# Patient Record
Sex: Male | Born: 1985 | Race: White | Hispanic: No | Marital: Married | Smoking: Former smoker
Health system: Southern US, Community
[De-identification: ages and names within clinical notes are randomized; demographics above are authoritative.]

## PROBLEM LIST (undated history)

## (undated) DIAGNOSIS — F32A Depression, unspecified: Secondary | ICD-10-CM

## (undated) DIAGNOSIS — Z8619 Personal history of other infectious and parasitic diseases: Secondary | ICD-10-CM

## (undated) DIAGNOSIS — F329 Major depressive disorder, single episode, unspecified: Secondary | ICD-10-CM

## (undated) HISTORY — DX: Depression, unspecified: F32.A

## (undated) HISTORY — DX: Personal history of other infectious and parasitic diseases: Z86.19

---
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
## 2006-03-12 HISTORY — PX: JOINT REPLACEMENT: SHX530

## 2016-01-18 ENCOUNTER — Ambulatory Visit (INDEPENDENT_AMBULATORY_CARE_PROVIDER_SITE_OTHER): Payer: Worker's Compensation | Admitting: Urgent Care

## 2016-01-18 VITALS — BP 118/82 | HR 104 | Temp 98.1°F | Resp 17 | Ht 76.5 in | Wt 269.0 lb

## 2016-01-18 DIAGNOSIS — S51812A Laceration without foreign body of left forearm, initial encounter: Secondary | ICD-10-CM

## 2016-01-18 DIAGNOSIS — Z026 Encounter for examination for insurance purposes: Secondary | ICD-10-CM | POA: Diagnosis not present

## 2016-01-18 NOTE — Progress Notes (Signed)
PROCEDURE NOTE: laceration repair Verbal consent obtained from patient.  Local anesthesia with 10cc Lidocaine 2% with epinephrine.  Wound explored for tendon, ligament damage. Wound scrubbed with soap and water and rinsed. Wound closed with #7 4-0 Prolene vertical mattress sutures.  Wound cleansed and dressed.  Benjiman CoreBrittany Wiseman, PA-C  [facility] 01/18/2016 6:09 PM

## 2016-01-18 NOTE — Progress Notes (Signed)
    MRN: [id number] DOB: 12/18/1985  Subjective:   Jeffrey Mooney is a 30 y.o. male presenting for worker's comp visit.   Reports suffering a laceration to his left forearm while at work today. Patient was using a razor blade, slipped and cut his forearm. He has applied pressure with a cloth to his laceration. Has not cleaned his wound. Denies fever, loss of ROM, weakness, numbness or tingling, swelling. He received his tetanus 2016 at his annual exam at a different practice.  Jeffrey Mooney's medications list, allergies, past medical history and past surgical history were reviewed and excluded from this note due to being a worker's comp case.   Objective:   Vitals: BP 118/82 (BP Location: Left Arm, Patient Position: Lying right side, Cuff Size: Small)   Pulse (!) 104   Temp 98.1 F (36.7 C) (Oral)   Resp 17   Ht 6' 4.5" (1.943 m)   Wt 269 lb (122 kg)   SpO2 98%   BMI 32.32 kg/m   Physical Exam  Constitutional: He is oriented to person, place, and time. He appears well-developed and well-nourished.  Pulmonary/Chest: Effort normal.  Musculoskeletal:       Left wrist: He exhibits normal range of motion, no tenderness, no bony tenderness, no swelling, no effusion, no crepitus and no deformity.       Left forearm: He exhibits tenderness (over laceration) and laceration (5.5cm). He exhibits no bony tenderness, no swelling, no edema and no deformity.  Neurological: He is alert and oriented to person, place, and time.   PROCEDURE NOTE: laceration repair Verbal consent obtained from patient.  Local anesthesia with 10cc Lidocaine 2% with epinephrine.  Wound explored for tendon, ligament damage. Wound scrubbed with soap and water and rinsed. Laceration repair performed by PA-Jeffrey Mooney, refer to note for detailed procedure.  Assessment and Plan :   1. Laceration of left forearm, initial encounter 2. Encounter related to worker's compensation claim - Wound care reviewed. Work restrictions  provided to patient. RTC for suture removal in 7-10 days, patient states that he may go to his PCP for this.  Jeffrey BambergMario Chenel Wernli, PA-C [facility] [phone] 01/18/2016 5:49 PM

## 2016-01-18 NOTE — Patient Instructions (Signed)

## 2016-01-20 ENCOUNTER — Telehealth: Payer: Self-pay

## 2016-01-20 NOTE — Telephone Encounter (Signed)
Patient's significant other called.  She states the patient needs paperwork stating that we stitched the cut on his arm and how big it was for the [employer]'s comp insurance.  She states that we need to fax the paperwork to [employer] if possible but she is also okay with picking it up in office.    Fax:[phone] phone: [phone]

## 2016-01-23 NOTE — Telephone Encounter (Signed)
Talked to pt to see if letter that Urban GibsonMani wrote at time of OV will work for [employer], or if something else is needed. He stated that he will ask her and if more is needed, he will have her call back with what further is needed.

## 2016-01-25 ENCOUNTER — Other Ambulatory Visit: Payer: Worker's Compensation

## 2016-01-26 ENCOUNTER — Ambulatory Visit (INDEPENDENT_AMBULATORY_CARE_PROVIDER_SITE_OTHER): Payer: Worker's Compensation | Admitting: Physician Assistant

## 2016-01-26 VITALS — BP 138/86 | HR 96 | Temp 98.3°F | Resp 18 | Ht 76.5 in | Wt 266.4 lb

## 2016-01-26 DIAGNOSIS — Z4802 Encounter for removal of sutures: Secondary | ICD-10-CM

## 2016-01-26 DIAGNOSIS — Z026 Encounter for examination for insurance purposes: Secondary | ICD-10-CM

## 2016-01-26 DIAGNOSIS — S51812D Laceration without foreign body of left forearm, subsequent encounter: Secondary | ICD-10-CM

## 2016-01-26 NOTE — Progress Notes (Signed)
   Patient: Jeffrey Mooney [id number]  Subjective: Jeffrey Mooney is returning for suture removal. Patient was initially seen 01/18/16 and had seven vertical mattress sutures placed. Denies fever, drainage of pus or blood, wound dehiscence, edema, pain.   Objective: Physical Exam  Constitutional: He is oriented to person, place, and time and well-developed, well-nourished, and in no distress.  HENT:  Head: Normocephalic and atraumatic.  Eyes: Conjunctivae are normal.  Neck: Normal range of motion.  Pulmonary/Chest: Effort normal.  Neurological: He is alert and oriented to person, place, and time. Gait normal.  Skin: Skin is warm and dry.     Psychiatric: Affect normal.  Vitals reviewed.   #7 sutures removed without incident.  Patient did get queasy and diaphoretic during suture removal. He was given a wet washcloth and a cup of water and felt much better after about 3 minutes. Steri strips applied to well healed laceration due to location and pt's job requiring constant use of his arms.   Assessment and Plan: 1. Encounter related to worker's compensation claim 2. Encounter for removal of sutures 3. Laceration of left forearm, subsequent encounter  Well-healed wound. Anticipatory guidance provided. Return to clinic as needed.  Benjiman CoreBrittany Emerly Prak, PA-C  [facility] 01/26/2016 12:53 PM

## 2016-01-26 NOTE — Patient Instructions (Addendum)
  Thank you for letting me participate in your health and well being.   Suture Removal, Care After Refer to this sheet in the next few weeks. These instructions provide you with information on caring for yourself after your procedure. Your health care provider may also give you more specific instructions. Your treatment has been planned according to current medical practices, but problems sometimes occur. Call your health care provider if you have any problems or questions after your procedure. What can I expect after the procedure? After your stitches (sutures) are removed, it is typical to have the following:  Some discomfort and swelling in the wound area.  Slight redness in the area. Follow these instructions at home:  If you have skin adhesive strips over the wound area, do not take the strips off. They will fall off on their own in a few days. If the strips remain in place after 14 days, you may remove them.  Change any bandages (dressings) at least once a day or as directed by your health care provider. If the bandage sticks, soak it off with warm, soapy water.  Apply cream or ointment only as directed by your health care provider. If using cream or ointment, wash the area with soap and water 2 times a day to remove all the cream or ointment. Rinse off the soap and pat the area dry with a clean towel.  Keep the wound area dry and clean. If the bandage becomes wet or dirty, or if it develops a bad smell, change it as soon as possible.  Continue to protect the wound from injury.  Use sunscreen when out in the sun. New scars become sunburned easily. Contact a health care provider if:  You have increasing redness, swelling, or pain in the wound.  You see pus coming from the wound.  You have a fever.  You notice a bad smell coming from the wound or dressing.  Your wound breaks open (edges not staying together). This information is not intended to replace advice given to you by  your health care provider. Make sure you discuss any questions you have with your health care provider. Document Released: 11/21/2000 Document Revised: 08/04/2015 Document Reviewed: 10/08/2012 Elsevier Interactive Patient Education  2017 [employer]     IF you received an x-ray today, you will receive an invoice from [facility]. Please contact [facility] at [phone] with questions or concerns regarding your invoice.   IF you received labwork today, you will receive an invoice from [facility]. Please contact [facility] at [phone] with questions or concerns regarding your invoice.   Our billing staff will not be able to assist you with questions regarding bills from these companies.  You will be contacted with the lab results as soon as they are available. The fastest way to get your results is to activate your My Chart account. Instructions are located on the last page of this paperwork. If you have not heard from us regarding the results in 2 weeks, please contact this office.

## 2016-12-12 ENCOUNTER — Ambulatory Visit
Admission: RE | Admit: 2016-12-12 | Discharge: 2016-12-12 | Disposition: A | Discharge status: Home / Self Care | Payer: BLUE CROSS/BLUE SHIELD | Source: Ambulatory Visit | Attending: Pain Medicine | Admitting: Pain Medicine

## 2016-12-12 ENCOUNTER — Other Ambulatory Visit: Payer: Self-pay | Admitting: Pain Medicine

## 2016-12-12 DIAGNOSIS — G8929 Other chronic pain: Secondary | ICD-10-CM

## 2016-12-12 DIAGNOSIS — M25562 Pain in left knee: Secondary | ICD-10-CM

## 2016-12-12 DIAGNOSIS — M25561 Pain in right knee: Secondary | ICD-10-CM

## 2016-12-12 DIAGNOSIS — M545 Low back pain: Principal | ICD-10-CM

## 2018-08-06 ENCOUNTER — Other Ambulatory Visit: Payer: Self-pay | Admitting: Chiropractic Medicine

## 2018-08-06 DIAGNOSIS — R52 Pain, unspecified: Secondary | ICD-10-CM

## 2018-08-06 DIAGNOSIS — R200 Anesthesia of skin: Secondary | ICD-10-CM

## 2018-08-06 DIAGNOSIS — R262 Difficulty in walking, not elsewhere classified: Secondary | ICD-10-CM

## 2018-10-17 ENCOUNTER — Emergency Department (HOSPITAL_COMMUNITY)
Admission: EM | Admit: 2018-10-17 | Discharge: 2018-10-17 | Disposition: A | Discharge status: Home / Self Care | Payer: BLUE CROSS/BLUE SHIELD | Attending: Emergency Medicine | Admitting: Emergency Medicine

## 2018-10-17 ENCOUNTER — Encounter (HOSPITAL_COMMUNITY): Payer: Self-pay

## 2018-10-17 ENCOUNTER — Other Ambulatory Visit: Payer: Self-pay

## 2018-10-17 DIAGNOSIS — M79652 Pain in left thigh: Secondary | ICD-10-CM | POA: Insufficient documentation

## 2018-10-17 DIAGNOSIS — M545 Low back pain: Secondary | ICD-10-CM | POA: Insufficient documentation

## 2018-10-17 DIAGNOSIS — M79605 Pain in left leg: Secondary | ICD-10-CM | POA: Insufficient documentation

## 2018-10-17 DIAGNOSIS — Z87891 Personal history of nicotine dependence: Secondary | ICD-10-CM | POA: Insufficient documentation

## 2018-10-17 DIAGNOSIS — Y9389 Activity, other specified: Secondary | ICD-10-CM | POA: Insufficient documentation

## 2018-10-17 DIAGNOSIS — Y92410 Unspecified street and highway as the place of occurrence of the external cause: Secondary | ICD-10-CM | POA: Insufficient documentation

## 2018-10-17 DIAGNOSIS — Y998 Other external cause status: Secondary | ICD-10-CM | POA: Insufficient documentation

## 2018-10-17 DIAGNOSIS — Z79899 Other long term (current) drug therapy: Secondary | ICD-10-CM | POA: Insufficient documentation

## 2018-10-17 DIAGNOSIS — F1210 Cannabis abuse, uncomplicated: Secondary | ICD-10-CM | POA: Insufficient documentation

## 2018-10-17 DIAGNOSIS — M7918 Myalgia, other site: Secondary | ICD-10-CM

## 2018-10-17 DIAGNOSIS — Z96651 Presence of right artificial knee joint: Secondary | ICD-10-CM | POA: Insufficient documentation
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
## 2018-10-17 NOTE — Discharge Instructions (Addendum)
I highly doubt you have a fracture. X-rays are essentially of no benefit in this scenario. I do not have a clear indication at this time for more advanced imaging such as CT or MRI. The therapy is anti-inflammatory medications and rest. I expect you to be sore for close to a week. I understand you are upset, but I do not expect this to give you long term impairment. If you develop new symptoms or are not happy with the progression of your symptoms then I want you re-checked by your PCP, sports medicine or orthopedics. Take 600 mg of ibuprofen every 6 hours as needed for pain.

## 2018-10-17 NOTE — ED Notes (Signed)
Discharge instructions reviewed with patient. Patient verbalizes understanding. VSS- patient reports he will follow up with PCP for hypertension. Patient very unhappy with his lack of xrays or medication today. Patient given number for patient experience.

## 2018-10-29 ENCOUNTER — Encounter (HOSPITAL_COMMUNITY): Payer: Self-pay

## 2018-10-29 ENCOUNTER — Emergency Department (HOSPITAL_COMMUNITY)
Admission: EM | Admit: 2018-10-29 | Discharge: 2018-10-29 | Disposition: A | Discharge status: Home / Self Care | Payer: No Typology Code available for payment source | Attending: Emergency Medicine | Admitting: Emergency Medicine

## 2018-10-29 ENCOUNTER — Other Ambulatory Visit: Payer: Self-pay

## 2018-10-29 ENCOUNTER — Emergency Department (HOSPITAL_COMMUNITY): Payer: No Typology Code available for payment source

## 2018-10-29 DIAGNOSIS — M545 Low back pain, unspecified: Secondary | ICD-10-CM

## 2018-10-29 DIAGNOSIS — R202 Paresthesia of skin: Secondary | ICD-10-CM | POA: Insufficient documentation

## 2018-10-29 DIAGNOSIS — Z79899 Other long term (current) drug therapy: Secondary | ICD-10-CM | POA: Diagnosis not present

## 2018-10-29 DIAGNOSIS — Z96651 Presence of right artificial knee joint: Secondary | ICD-10-CM | POA: Diagnosis not present

## 2018-10-29 DIAGNOSIS — F1210 Cannabis abuse, uncomplicated: Secondary | ICD-10-CM | POA: Diagnosis not present

## 2018-10-29 DIAGNOSIS — Z87891 Personal history of nicotine dependence: Secondary | ICD-10-CM | POA: Diagnosis not present

## 2018-10-29 DIAGNOSIS — R200 Anesthesia of skin: Secondary | ICD-10-CM | POA: Insufficient documentation

## 2018-10-29 DIAGNOSIS — M5416 Radiculopathy, lumbar region: Secondary | ICD-10-CM | POA: Diagnosis not present

## 2018-10-29 DIAGNOSIS — M25552 Pain in left hip: Secondary | ICD-10-CM | POA: Insufficient documentation

## 2018-10-29 MED ORDER — METHOCARBAMOL 750 MG PO TABS: 750.0000 mg | ORAL_TABLET | Freq: Two times a day (BID) | ORAL | 0 refills | Status: DC

## 2018-10-29 MED ORDER — MELOXICAM 7.5 MG PO TABS: 7.5000 mg | ORAL_TABLET | Freq: Every day | ORAL | 0 refills | Status: DC

## 2018-10-29 NOTE — Discharge Instructions (Addendum)
Stop taking Aleve or ibuprofen and begin taking Mobic as prescribed once daily (do not take more than this). This medication may take a couple days to notice a difference.  You can alternate with Tylenol.  Take Robaxin twice daily as needed for muscle pain or spasms.  Do not drive or operate machinery while taking this medication.  Attempt the exercises and stretches below daily as tolerated.  Use ice and heat alternating 20 minutes on, 20 minutes off.  Please follow-up with 1 of the spine specialist below for further evaluation and treatment of your ongoing back pain.  Please return immediately if you develop complete numbness of your groin or leg or loss of bowel or bladder control.
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
## 2019-05-26 ENCOUNTER — Encounter: Payer: Self-pay | Admitting: Physician Assistant

## 2019-05-26 ENCOUNTER — Ambulatory Visit (INDEPENDENT_AMBULATORY_CARE_PROVIDER_SITE_OTHER): Payer: 59 | Admitting: Physician Assistant

## 2019-05-26 ENCOUNTER — Other Ambulatory Visit: Payer: Self-pay

## 2019-05-26 DIAGNOSIS — F329 Major depressive disorder, single episode, unspecified: Secondary | ICD-10-CM

## 2019-05-26 DIAGNOSIS — F419 Anxiety disorder, unspecified: Secondary | ICD-10-CM | POA: Diagnosis not present

## 2019-05-26 DIAGNOSIS — M5126 Other intervertebral disc displacement, lumbar region: Secondary | ICD-10-CM | POA: Insufficient documentation

## 2019-05-26 DIAGNOSIS — F32A Depression, unspecified: Secondary | ICD-10-CM | POA: Insufficient documentation

## 2019-05-26 MED ORDER — BUPROPION HCL ER (XL) 150 MG PO TB24: 150.0000 mg | ORAL_TABLET | Freq: Every day | ORAL | 1 refills | Status: DC

## 2019-05-26 NOTE — Progress Notes (Signed)
   Virtual Visit via Video   I connected with patient on 05/26/19 at  1:00 PM EDT by a video enabled telemedicine application and verified that I am speaking with the correct person using two identifiers.  Location patient: Home Location provider: [facility], [facility] Persons participating in the virtual visit: Patient, Provider, CMA (Patina Moore)  I discussed the limitations of evaluation and management by telemedicine and the availability of in person appointments. The patient expressed understanding and agreed to proceed.  Subjective:   HPI:   Patient presents via [facility] today to establish care. Patient endorses longstanding history of depression, worsening over the past several months. Endorses associated stress with mind racing and some anhedonia. Denies SI/HI. Has been on medications in the past including Prozac and Hydroxyzine. Currently on Sertraline 50 mg which he notes helps slightly but causes significant sexual side effect. Would like to discuss other options. Is open to counseling.    ROS:   See pertinent positives and negatives per HPI.  There are no problems to display for this patient.   Social History   Tobacco Use  . Smoking status: Former Smoker    Types: Cigarettes    Quit date: 10/16/2012    Years since quitting: 6.6  . Smokeless tobacco: Never Used  Substance Use Topics  . Alcohol use: Yes    Alcohol/week: 7.0 standard drinks    Types: 7 Cans of beer per week    Current Outpatient Medications:  .  sertraline (ZOLOFT) 50 MG tablet, Take 50 mg by mouth daily., Disp: , Rfl:   Allergies  Allergen Reactions  . Morphine And Related Swelling    Objective:   There were no vitals taken for this visit.  Patient is well-developed, well-nourished in no acute distress.  Resting comfortably at home.  Head is normocephalic, atraumatic.  No labored breathing.  Speech is clear and coherent with logical content.  Patient is alert and oriented at  baseline.   Assessment and Plan:    1. Anxiety and depression Discussed options. Will wean down off of Sertraline taking 1/2 tablet daily for 1 week, then 1/2 tablet every other day for 1 week before stopping. Start Wellbutrin XL at 150 mg once daily. Follow-up 3-4 weeks. Number for [facility] given to patient to schedule a therapy appointment.  - buPROPion (WELLBUTRIN XL) 150 MG 24 hr tablet; Take 1 tablet (150 mg total) by mouth daily.  Dispense: 30 tablet; Refill: 1   Piedad Climes, [place] 05/26/2019

## 2019-05-26 NOTE — Patient Instructions (Signed)
Instructions sent to MyChart.  Please decrease the Sertraline to 1/2 tablet daily for 1 week. Then 1/2 tablet daily every other day for 1 week before completely stopping.  Start the Wellbutrin once daily each morning.   Call our counseling services at [phone] to schedule an appointment.  We will follow-up in 3-4 weeks for reassessment. Sooner if needed. We can do your physical in office at the same time if you prefer.   It was very nice meeting you today. Welcome to [facility]!

## 2019-05-26 NOTE — Progress Notes (Signed)
I have discussed the procedure for the virtual visit with the patient who has given consent to proceed with assessment and treatment.   Jeffrey Mooney, CMA     

## 2019-06-16 ENCOUNTER — Telehealth: Payer: Self-pay | Admitting: Emergency Medicine

## 2019-06-16 ENCOUNTER — Telehealth: Payer: Self-pay | Admitting: Physician Assistant

## 2019-06-16 DIAGNOSIS — F419 Anxiety disorder, unspecified: Secondary | ICD-10-CM

## 2019-06-16 DIAGNOSIS — F329 Major depressive disorder, single episode, unspecified: Secondary | ICD-10-CM

## 2019-06-16 DIAGNOSIS — F32A Depression, unspecified: Secondary | ICD-10-CM

## 2019-06-16 MED ORDER — ESCITALOPRAM OXALATE 10 MG PO TABS: 10.0000 mg | ORAL_TABLET | Freq: Every day | ORAL | 1 refills | Status: DC

## 2019-06-16 MED ORDER — VORTIOXETINE HBR 20 MG PO TABS: 10.0000 mg | ORAL_TABLET | Freq: Every day | ORAL | 1 refills | Status: DC

## 2019-06-16 NOTE — Telephone Encounter (Signed)
Advised patient of PCP changing Wellbutrin to Trintellix. Patient is agreeable with switching medication. He was taking Prozac first but didn't feel strong enough, then Sertraline caused sexual side effects and Wellbutrin is causing anger and irritable symptoms. Rx for Trintellix 10 mg [facility]. Advised to follow up in 2 weeks.

## 2019-06-16 NOTE — Telephone Encounter (Signed)
Patient called in asking if Selena Batten could prescribe him something other than the buPROPion (WELLBUTRIN XL) 150 MG 24 hr tablet states it is having an opposite effect on him.

## 2019-06-16 NOTE — Telephone Encounter (Signed)
Called patient but unable to leave a message on VM due to VM not set up.

## 2019-06-16 NOTE — Telephone Encounter (Signed)
Patient states when he started taking the Wellbutrin. He has become very irritable, very angry. Please advise

## 2019-06-16 NOTE — Telephone Encounter (Signed)
PA denied dor Trintellix thru patient insurance Since he has failed Prozac the other medications to fail are Cymbalta, Effexor, Pristiq, Fetxima and Viibryd Please advise

## 2019-06-16 NOTE — Addendum Note (Signed)
Addended by: Con Memos on: 06/16/2019 03:25 PM   Modules accepted: Orders

## 2019-06-16 NOTE — Telephone Encounter (Signed)
Would actually recommend consider trial of Lexapro first (verify that he has not taken in the past) as statistically has lower side effect profile than SNRI. If he has taken before we will go with Effexor XR 37.5 mg once daily. If he has not taken, ok to send in Lexapro 10 mg once daily. Quant 30 with 1 refill. Follow-up in in a few weeks.

## 2019-06-16 NOTE — Telephone Encounter (Signed)
Spoke with patient and he denies ever taking Lexapro. Rx for Lexparo 10 mg QD sent to the pharmacy.

## 2019-06-16 NOTE — Telephone Encounter (Signed)
I know he has had significant issue with sexual side effects on sertraline which is why we switched to Wellbutrin.  Has also been on Prozac previously.  Would recommend we consider trying Trintellix as this has a very low risk of any sexual side effects.  Suspect insurance will cover this since he has tried and failed sertraline, fluoxetine and Wellbutrin.  Okay to send in Rx for 10 mg Trintellix, quantity 30 with 1 refill.  He will go ahead and take this instead of his Wellbutrin.  If there is any issue getting from the pharmacy, please let us know and we will select an alternative agent.  Would have him follow-up with me in 2 weeks after medication change.

## 2019-06-16 NOTE — Telephone Encounter (Signed)
Prior authorization initiated thru Cover My Meds portal for patient medication Trintellix 20 mg  Key: [facility] [employer]  PA Case ID: [id number]  Rx #: [id number]

## 2019-07-09 ENCOUNTER — Other Ambulatory Visit: Payer: Self-pay | Admitting: Physician Assistant

## 2019-07-09 DIAGNOSIS — F419 Anxiety disorder, unspecified: Secondary | ICD-10-CM

## 2019-07-09 DIAGNOSIS — F329 Major depressive disorder, single episode, unspecified: Secondary | ICD-10-CM

## 2019-07-28 ENCOUNTER — Other Ambulatory Visit: Payer: Self-pay | Admitting: Physician Assistant

## 2019-07-28 DIAGNOSIS — F32A Depression, unspecified: Secondary | ICD-10-CM

## 2019-08-09 ENCOUNTER — Other Ambulatory Visit: Payer: Self-pay | Admitting: Physician Assistant

## 2019-08-09 DIAGNOSIS — F32A Depression, unspecified: Secondary | ICD-10-CM

## 2019-08-11 ENCOUNTER — Encounter: Payer: Self-pay | Admitting: Emergency Medicine

## 2019-08-11 NOTE — Telephone Encounter (Signed)
My chart message sent to patient of how he is tolerating medication.

## 2019-09-04 ENCOUNTER — Other Ambulatory Visit: Payer: Self-pay | Admitting: Physician Assistant

## 2019-09-04 DIAGNOSIS — F32A Depression, unspecified: Secondary | ICD-10-CM

## 2019-09-24 ENCOUNTER — Other Ambulatory Visit: Payer: Self-pay

## 2019-09-24 ENCOUNTER — Encounter: Payer: Self-pay | Admitting: Physician Assistant

## 2019-09-24 ENCOUNTER — Telehealth (INDEPENDENT_AMBULATORY_CARE_PROVIDER_SITE_OTHER): Payer: 59 | Admitting: Physician Assistant

## 2019-09-24 DIAGNOSIS — F419 Anxiety disorder, unspecified: Secondary | ICD-10-CM

## 2019-09-24 DIAGNOSIS — F329 Major depressive disorder, single episode, unspecified: Secondary | ICD-10-CM

## 2019-09-24 MED ORDER — ESCITALOPRAM OXALATE 20 MG PO TABS: 20.0000 mg | ORAL_TABLET | Freq: Every day | ORAL | 3 refills | Status: DC

## 2019-09-24 NOTE — Progress Notes (Signed)
   Virtual Visit via Video   I connected with patient on 09/24/19 at  9:00 AM EDT by a video enabled telemedicine application and verified that I am speaking with the correct person using two identifiers.  Location patient: Home Location provider: [facility], Office Persons participating in the virtual visit: Patient, Provider, CMA (Patina Moore)  I discussed the limitations of evaluation and management by telemedicine and the availability of in person appointments. The patient expressed understanding and agreed to proceed.  Subjective:   HPI:   Patient presents via [facility] today for follow-up regarding anxiety and mood. Patient is currently on a regimen of Lexapro 10 mg daily. Endorses taking medication as directed and tolerating well overall. Notes initially doing very well with current dose, with improved mood and resolution of anhedonia. Over the past few weeks has noted that he is feeling more down. Has noted sleeping more and feeling very disinterested in things that he typically enjoys. Denies SI/HI. Denies any new or worsening stressors. Denies recent change at work or at home.   ROS:   See pertinent positives and negatives per HPI.  Patient Active Problem List   Diagnosis Date Noted  . Lumbar herniated disc 05/26/2019  . Anxiety and depression 05/26/2019    Social History   Tobacco Use  . Smoking status: Former Smoker    Types: Cigarettes    Quit date: 10/16/2012    Years since quitting: 6.9  . Smokeless tobacco: Never Used  Substance Use Topics  . Alcohol use: Yes    Alcohol/week: 12.0 standard drinks    Types: 12 Cans of beer per week    Current Outpatient Medications:  .  escitalopram (LEXAPRO) 10 MG tablet, Take 1 tablet (10 mg total) by mouth daily. Please call the office to schedule a follow up appointment for refills, Disp: 30 tablet, Rfl: 1  Allergies  Allergen Reactions  . Morphine And Related Swelling    Objective:   There were no vitals  taken for this visit.  Patient is well-developed, well-nourished in no acute distress.  Resting comfortably at home.  Head is normocephalic, atraumatic.  No labored breathing.  Speech is clear and coherent with logical content.  Patient is alert and oriented at baseline.   Assessment and Plan:   1. Anxiety and depression Deteriorated. No alarm signs or symptoms. Will increase Lexapro to 20 mg daily. Follow-up 4-6 weeks via video visit. Sooner if needed.     Piedad Climes, PA-C 09/24/2019

## 2019-09-24 NOTE — Progress Notes (Signed)
I have discussed the procedure for the virtual visit with the patient who has given consent to proceed with assessment and treatment.   Jeffrey Mooney S Tamara Kenyon, CMA     

## 2019-10-20 ENCOUNTER — Encounter: Payer: Self-pay | Admitting: Emergency Medicine

## 2019-10-20 ENCOUNTER — Other Ambulatory Visit: Payer: Self-pay | Admitting: Physician Assistant

## 2019-10-20 DIAGNOSIS — F419 Anxiety disorder, unspecified: Secondary | ICD-10-CM

## 2019-10-21 ENCOUNTER — Other Ambulatory Visit: Payer: Self-pay | Admitting: Physician Assistant

## 2019-10-21 DIAGNOSIS — F419 Anxiety disorder, unspecified: Secondary | ICD-10-CM

## 2019-11-19 ENCOUNTER — Encounter: Payer: Self-pay | Admitting: Physician Assistant

## 2019-11-19 ENCOUNTER — Telehealth (INDEPENDENT_AMBULATORY_CARE_PROVIDER_SITE_OTHER): Payer: 59 | Admitting: Physician Assistant

## 2019-11-19 ENCOUNTER — Other Ambulatory Visit: Payer: Self-pay

## 2019-11-19 DIAGNOSIS — F3162 Bipolar disorder, current episode mixed, moderate: Secondary | ICD-10-CM | POA: Diagnosis not present

## 2019-11-19 MED ORDER — LAMOTRIGINE 25 MG PO TABS: ORAL_TABLET | ORAL | 1 refills | Status: DC

## 2019-11-19 NOTE — Progress Notes (Signed)
I have discussed the procedure for the virtual visit with the patient who has given consent to proceed with assessment and treatment.   Gerhart Ruggieri S Cindee Mclester, CMA     

## 2019-11-19 NOTE — Progress Notes (Signed)
   Virtual Visit via Video   I connected with patient on 11/19/19 at  9:00 AM EDT by a video enabled telemedicine application and verified that I am speaking with the correct person using two identifiers.  Location patient: Home Location provider: [place] [facility], Office Persons participating in the virtual visit: Patient, Provider, CMA (Patina Moore)  I discussed the limitations of evaluation and management by telemedicine and the availability of in person appointments. The patient expressed understanding and agreed to proceed.  Subjective:   HPI:   Patient presents via Careg[facility] today to follow-up regarding depression. At last visit patient was noting some improved symptoms on Lexapro so the dose was increased to 20 mg daily. Notes with increased dose having significant irritability and almost explosive behavior. States he had almost a feeling of invincibility, making very rash decisions. Notes he did not feel himself. States he spoke to his previous behavioral health provider in [place] who had concerns of mania and potential underlying diagnosis of BPD. Notes only prior diagnosis from this provider was GAD. He weaned himself off of the Lexapro over the past few weeks. Denies SI/HI.    ROS:   See pertinent positives and negatives per HPI.  Patient Active Problem List   Diagnosis Date Noted  . Lumbar herniated disc 05/26/2019  . Anxiety and depression 05/26/2019    Social History   Tobacco Use  . Smoking status: Former Smoker    Types: Cigarettes    Quit date: 10/16/2012    Years since quitting: 7.0  . Smokeless tobacco: Never Used  Substance Use Topics  . Alcohol use: Yes    Alcohol/week: 12.0 standard drinks    Types: 12 Cans of beer per week    Current Outpatient Medications:  .  escitalopram (LEXAPRO) 20 MG tablet, TAKE 1 TABLET BY MOUTH EVERY DAY (Patient not taking: Reported on 11/19/2019), Disp: 90 tablet, Rfl: 0  Allergies  Allergen Reactions  . Morphine  And Related Swelling    Objective:   There were no vitals taken for this visit.  Patient is well-developed, well-nourished in no acute distress.  Resting comfortably at home.  Head is normocephalic, atraumatic.  No labored breathing.  Speech is clear and coherent with logical content.  Patient is alert and oriented at baseline.   Assessment and Plan:   1. Bipolar disorder, current episode mixed, moderate (HCC) SSRI resulted in manic behavior, concerning for BPD. Patient has reached out to prior [facility] provider in [place] who also agrees with diagnosis of BPD. Will start Lamictal at 25 mg x 2 weeks, increasing to 50 mg. Will likely need further titration within the next 3-4 weeks. Referral to psych placed for further management.  Will plan on office follow-up in 1 month if he has not already seen Psychiatry so we can recheck lamictal level and CBC.  - Ambulatory referral to Psychiatry - lamoTRIgine (LAMICTAL) 25 MG tablet; Take 1 tablet daily x 2 weeks. Then increase to 2 tablets (50 mg) daily.  Dispense: 60 tablet; Refill: 1    Piedad Climes, [place] 11/19/2019

## 2019-12-11 ENCOUNTER — Telehealth: Payer: Self-pay | Admitting: Physician Assistant

## 2019-12-11 NOTE — Telephone Encounter (Signed)
Pt called in stating that he is currently taking Lamictal he wanted to see about adding Trintellix as well. Pt can be reached at the home # and [facility] [address]

## 2019-12-11 NOTE — Telephone Encounter (Signed)
Please advise 

## 2019-12-11 NOTE — Telephone Encounter (Signed)
Would not want to add that on at the moment.  Would work on continue titrating Lamictal to get mood stable before we consider adding back on an SSRI.  So also will be something to discuss with psychiatry --referral placed at last visit

## 2019-12-12 ENCOUNTER — Other Ambulatory Visit: Payer: Self-pay | Admitting: Physician Assistant

## 2019-12-12 DIAGNOSIS — F3162 Bipolar disorder, current episode mixed, moderate: Secondary | ICD-10-CM

## 2019-12-14 ENCOUNTER — Other Ambulatory Visit: Payer: Self-pay | Admitting: Physician Assistant

## 2019-12-14 NOTE — Telephone Encounter (Signed)
Called patient in reference to PCP recommendations. Patient voiced understanding. States his previous doctor put him on rexulti. Asked about dosing and supply quantity. Patient did not go into details.

## 2019-12-14 NOTE — Telephone Encounter (Signed)
FYI, called patient with PCP recommendations. He states he did not mean to step on any toes. He states he speaks with his previous doctor from time to time because he was seen there for so long. He states they are up in [place]. He states he just gets advice from the previous doctor to go over with PCP. He states he is not taking Rexulti nor has he been on it before. He states his old doctor told him he should ask your advice/opinion on the medication. Patient states seeing his previous doctor is not an option.

## 2019-12-14 NOTE — Telephone Encounter (Signed)
If he is speaking with prior doctor about symptoms as well then he can continue care with them or will need to let us prescribe appropriate medications while awaiting psychiatric referral. Should not be multiple providers prescribing medications for the same issue. I will defer further refills of Lamictal to this prior psychiatric provider.

## 2019-12-19 ENCOUNTER — Other Ambulatory Visit: Payer: Self-pay | Admitting: Physician Assistant

## 2019-12-19 DIAGNOSIS — F419 Anxiety disorder, unspecified: Secondary | ICD-10-CM

## 2020-01-11 ENCOUNTER — Telehealth (HOSPITAL_COMMUNITY): Payer: 59 | Admitting: Behavioral Health

## 2020-01-11 ENCOUNTER — Other Ambulatory Visit: Payer: Self-pay

## 2020-01-12 ENCOUNTER — Telehealth (INDEPENDENT_AMBULATORY_CARE_PROVIDER_SITE_OTHER): Payer: 59 | Admitting: Physician Assistant

## 2020-01-12 ENCOUNTER — Other Ambulatory Visit: Payer: Self-pay

## 2020-01-12 ENCOUNTER — Encounter (HOSPITAL_COMMUNITY): Payer: Self-pay | Admitting: Physician Assistant

## 2020-01-12 DIAGNOSIS — F3160 Bipolar disorder, current episode mixed, unspecified: Secondary | ICD-10-CM | POA: Diagnosis not present

## 2020-01-12 DIAGNOSIS — F32A Depression, unspecified: Secondary | ICD-10-CM

## 2020-01-12 DIAGNOSIS — F319 Bipolar disorder, unspecified: Secondary | ICD-10-CM | POA: Insufficient documentation

## 2020-01-12 MED ORDER — LAMOTRIGINE 25 MG PO TABS: 75.0000 mg | ORAL_TABLET | Freq: Every day | ORAL | 2 refills | Status: DC

## 2020-01-12 MED ORDER — BUPROPION HCL ER (XL) 150 MG PO TB24: 150.0000 mg | ORAL_TABLET | ORAL | 2 refills | Status: DC
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
## 2020-01-16 ENCOUNTER — Other Ambulatory Visit: Payer: Self-pay | Admitting: Physician Assistant

## 2020-01-16 DIAGNOSIS — F3160 Bipolar disorder, current episode mixed, unspecified: Secondary | ICD-10-CM

## 2020-02-17 ENCOUNTER — Other Ambulatory Visit: Payer: Self-pay

## 2020-02-17 ENCOUNTER — Telehealth (INDEPENDENT_AMBULATORY_CARE_PROVIDER_SITE_OTHER): Payer: 59 | Admitting: Physician Assistant

## 2020-02-17 DIAGNOSIS — F32A Depression, unspecified: Secondary | ICD-10-CM

## 2020-02-17 DIAGNOSIS — F3160 Bipolar disorder, current episode mixed, unspecified: Secondary | ICD-10-CM | POA: Diagnosis not present

## 2020-02-17 NOTE — Progress Notes (Signed)
BH MD/PA/NP OP Progress Note  Virtual Visit via Telephone Note  I connected with Jeffrey Mooney on 02/17/2020 at  1:00 PM EST by telephone and verified that I am speaking with the correct person using two identifiers.  Location: Patient: Work Provider: Clinic   I discussed the limitations, risks, security and privacy concerns of performing an evaluation and management service by telephone and the availability of in person appointments. I also discussed with the patient that there may be a patient responsible charge related to this service. The patient expressed understanding and agreed to proceed.  Follow Up Instructions:   I discussed the assessment and treatment plan with the patient. The patient was provided an opportunity to ask questions and all were answered. The patient agreed with the plan and demonstrated an understanding of the instructions.   The patient was advised to call back or seek an in-person evaluation if the symptoms worsen or if the condition fails to improve as anticipated.  I provided 29 minutes of non-face-to-face time during this encounter.  Jeffrey Hatchet, PA   02/17/2020 1:20 PM Jeffrey Mooney  MRN:  [id number]  Chief Complaint: Follow-up and medication management  HPI:   Jeffrey Mooney is a 34 year old male with a past psychiatric history significant for bipolar disorder who presents to [facility] via virtual telephone visit for follow-up and medication management.  Patient is currently being managed on the following medications:  Bupropion 150 mg 24-hour tablet daily Lamictal 25 mg (3 tablets 75 mg total)  Patient reports that his medications are still coming along.  Patient reports that he went through a tough transition when dealing with his medications.  He states that he has experienced a lot of emotional bullshit in the form of mood swings.  He describes feeling overly emotional with and frequent mood swings  where 1 minute he will be happy, and the next minute, crying.  Patient also reports that he has been experiencing sexual side effects that manifest as not wanting to have sex. Patient expresses that he feels "more okay" than "not okay" and reports that he does feel like the medications are doing their job. He also reports that his coworkers have noticed a difference in his mood.    Writer inquired to patient on whether or not he wanted to adjust his medication dosages at this time.  He states that he would rather give the medication some time to work and ride out any of the side effects that he is feeling.  Patient denies the need for refills at this time.  Patient states that his mood has been more better than not.  He denies suicidal or homicidal ideations, however, he states that he hates people in general but does not wish to hurt anyone.  He further denies auditory or visual hallucinations.  Patient endorses fair sleep and appetite.  He endorses alcohol use and states that he consumes way too much.  He denies tobacco use and illicit drug use.  Patient reports no stressors at this time.  Visit Diagnosis:    ICD-10-CM   1. Bipolar affective disorder, current episode mixed, current episode severity unspecified (HCC)  F31.60   2. Depression, unspecified depression type  F32.A     Past Psychiatric History: Bipolar affective disorder, current episode mixed  Past Medical History:  Past Medical History:  Diagnosis Date  . Depression   . History of chickenpox     Past Surgical History:  Procedure Laterality Date  .  JOINT REPLACEMENT Right 2008   Right knee - torn meniscus    Family Psychiatric History: No family history of psychiatric illness  Family History:  Family History  Problem Relation Age of Onset  . Healthy Mother   . Healthy Father   . Healthy Sister   . Healthy Sister   . Lung cancer Maternal Grandmother     Social History:  Social History   Socioeconomic History  .  Marital status: Married    Spouse name: Not on file  . Number of children: Not on file  . Years of education: Not on file  . Highest education level: Not on file  Occupational History  . Not on file  Tobacco Use  . Smoking status: Former Smoker    Types: Cigarettes    Quit date: 10/16/2012    Years since quitting: 7.3  . Smokeless tobacco: Never Used  Vaping Use  . Vaping Use: Former  . Substances: THC, CBD, Mixture of cannabinoids  Substance and Sexual Activity  . Alcohol use: Yes    Alcohol/week: 12.0 standard drinks    Types: 12 Cans of beer per week  . Drug use: Not Currently    Types: Hydrocodone    Comment: not prescribed- previously  . Sexual activity: Not Currently  Other Topics Concern  . Not on file  Social History Narrative  . Not on file   Social Determinants of Health   Financial Resource Strain:   . Difficulty of Paying Living Expenses: Not on file  Food Insecurity:   . Worried About Programme researcher, broadcasting/film/video in the Last Year: Not on file  . Ran Out of Food in the Last Year: Not on file  Transportation Needs:   . Lack of Transportation (Medical): Not on file  . Lack of Transportation (Non-Medical): Not on file  Physical Activity:   . Days of Exercise per Week: Not on file  . Minutes of Exercise per Session: Not on file  Stress:   . Feeling of Stress : Not on file  Social Connections:   . Frequency of Communication with Friends and Family: Not on file  . Frequency of Social Gatherings with Friends and Family: Not on file  . Attends Religious Services: Not on file  . Active Member of Clubs or Organizations: Not on file  . Attends Banker Meetings: Not on file  . Marital Status: Not on file    Allergies:  Allergies  Allergen Reactions  . Morphine And Related Swelling    Metabolic Disorder Labs: No results found for: HGBA1C, MPG No results found for: PROLACTIN No results found for: CHOL, TRIG, HDL, CHOLHDL, VLDL, LDLCALC No results found  for: TSH  Therapeutic Level Labs: No results found for: LITHIUM No results found for: VALPROATE No components found for:  CBMZ  Current Medications: Current Outpatient Medications  Medication Sig Dispense Refill  . buPROPion (WELLBUTRIN XL) 150 MG 24 hr tablet Take 1 tablet (150 mg total) by mouth every morning. 30 tablet 2  . lamoTRIgine (LAMICTAL) 25 MG tablet Take 3 tablets (75 mg total) by mouth daily. 90 tablet 2   No current facility-administered medications for this visit.     Musculoskeletal: Strength & Muscle Tone: Unable to assess due to telemedicine visit Gait & Station: Unable to assess due to telemedicine visit Patient leans: Unable to assess due to telemedicine visit  Psychiatric Specialty Exam: Review of Systems  Psychiatric/Behavioral: Positive for agitation. Negative for dysphoric mood (Patient reports there instances  where he may experience mood swings), hallucinations, self-injury, sleep disturbance and suicidal ideas. The patient is not hyperactive.     There were no vitals taken for this visit.There is no height or weight on file to calculate BMI.  General Appearance: Unable to assess due to telemedicine visit  Eye Contact:  Unable to assess due to telemedicine visit  Speech:  Clear and Coherent and Normal Rate  Volume:  Normal  Mood:  Euthymic and Irritable  Affect:  Appropriate  Thought Process:  Coherent and Descriptions of Associations: Intact  Orientation:  Full (Time, Place, and Person)  Thought Content: WDL and Logical   Suicidal Thoughts:  No  Homicidal Thoughts:  Yes.  without intent/plan  Memory:  Immediate;   Good Recent;   Good Remote;   Good  Judgement:  Good  Insight:  Fair  Psychomotor Activity:  Normal  Concentration:  Concentration: Good and Attention Span: Good  Recall:  Good  Fund of Knowledge: Good  Language: Good  Akathisia:  NA  Handed:  Right  AIMS (if indicated): not done  Assets:  Communication Skills Desire for  Improvement Financial Resources/Insurance Housing Social Support Vocational/Educational  ADL's:  Intact  Cognition: WNL  Sleep:  Fair   Screenings: PHQ2-9     Video Visit from 09/24/2019 in [facility] Visit from 05/26/2019 in [facility] Visit from 01/26/2016 in Primary Care at [facility] Total Score 4 2 0  PHQ-9 Total Score 7 6 --       Assessment and Plan:   Jeffrey Mooney is a 34 year old male with a past psychiatric history significant for bipolar disorder who presents to [facility] via virtual telephone visit for follow-up and medication management.  Patient is currently being managed on the following medications:  Bupropion 150 mg 24-hour tablet daily Lamictal 25 mg (3 tablets 75 mg total)  Patient reports that he has been experiencing the following side effects from his medications: feeling overly emotional, infrequent mood swings, and sexual dysfunction that manifest as lack of interest in sexual intercourse.  Patient denies the need for dosage adjustments at this time and states that he will ride out the side effects of his medications.  Patient does report that he feels that his mood is improving and that on most days he is in a good mood more so than a bad mood.  Patient does not need any refills at this time.  1. Bipolar affective disorder, current episode mixed, current episode severity unspecified (HCC) Patient to continue taking lamotrigine 75 mg daily (3 tablets, 25 mg) Patient to continue taking Wellbutrin 150 mg 24-hour tablet daily  Patient to follow-up in 6 weeks   Jeffrey Hatchet, PA 02/17/2020, 1:20 PM

## 2020-03-30 ENCOUNTER — Encounter (HOSPITAL_COMMUNITY): Payer: Self-pay | Admitting: Physician Assistant

## 2020-03-30 ENCOUNTER — Telehealth (INDEPENDENT_AMBULATORY_CARE_PROVIDER_SITE_OTHER): Payer: 59 | Admitting: Physician Assistant

## 2020-03-30 ENCOUNTER — Other Ambulatory Visit: Payer: Self-pay

## 2020-03-30 DIAGNOSIS — F3160 Bipolar disorder, current episode mixed, unspecified: Secondary | ICD-10-CM

## 2020-03-30 DIAGNOSIS — F32A Depression, unspecified: Secondary | ICD-10-CM

## 2020-03-30 NOTE — Progress Notes (Signed)
BH MD/PA/NP OP Progress Note  Virtual Visit via Telephone Note  I connected with Jeffrey Mooney on 03/30/20 at 11:00 AM EST by telephone and verified that I am speaking with the correct person using two identifiers.  Location: Patient: Home Provider: Clinic   I discussed the limitations, risks, security and privacy concerns of performing an evaluation and management service by telephone and the availability of in person appointments. I also discussed with the patient that there may be a patient responsible charge related to this service. The patient expressed understanding and agreed to proceed.  Follow Up Instructions:  I discussed the assessment and treatment plan with the patient. The patient was provided an opportunity to ask questions and all were answered. The patient agreed with the plan and demonstrated an understanding of the instructions.   The patient was advised to call back or seek an in-person evaluation if the symptoms worsen or if the condition fails to improve as anticipated.  I provided 21 minutes of non-face-to-face time during this encounter.   Jeffrey Hatchet, PA   03/30/2020 9:30 PM Jeffrey Mooney  MRN:  [id number]  Chief Complaint:  Follow-up and medication management   HPI:  Jeffrey Mooney is a 35 year old male with a past psychiatric history significant for bipolar disorder who presents to [facility] via virtual telephone visit for follow-up and medication management.  Patient is currently being managed on the following medications:  Bupropion 150 mg 24-hour tablet daily Lamictal 25 mg (3 tablets 75 mg total)  Patient reports no issues or concerns regarding his medication regimen.  Patient reports that the only issue that he has is that he is experiencing decreased sex drive.  Patient reports that he often loses interest in the middle of sex.  Patient reports that the combination of medications are working  great.  Patient being in a happy mood most of the time.  Patient denies suicidal and homicidal ideations.  He further denies auditory or visual hallucinations.  Patient reports that he receives on average 6 to 7 hours of sleep.  Patient reports that some days he gets less sleep.  Patient reports that some days he feels like he is going to crash from fatigue.  Patient endorses appetite and eats on average 1-2 meals per day.  Patient endorses alcohol consumption and states that he is still drinking the same amount of alcohol.  Patient denies tobacco use and illicit drug use.   Visit Diagnosis:    ICD-10-CM   1. Bipolar affective disorder, current episode mixed, current episode severity unspecified (HCC)  F31.60     Past Psychiatric History:  Bipolar affective disorder, current episode mixed  Past Medical History:  Past Medical History:  Diagnosis Date  . Depression   . History of chickenpox     Past Surgical History:  Procedure Laterality Date  . JOINT REPLACEMENT Right 2008   Right knee - torn meniscus    Family Psychiatric History:  No family history of psychiatric illness  Family History:  Family History  Problem Relation Age of Onset  . Healthy Mother   . Healthy Father   . Healthy Sister   . Healthy Sister   . Lung cancer Maternal Grandmother     Social History:  Social History   Socioeconomic History  . Marital status: Married    Spouse name: Not on file  . Number of children: Not on file  . Years of education: Not on file  . Highest education  level: Not on file  Occupational History  . Not on file  Tobacco Use  . Smoking status: Former Smoker    Types: Cigarettes    Quit date: 10/16/2012    Years since quitting: 7.4  . Smokeless tobacco: Never Used  Vaping Use  . Vaping Use: Former  . Substances: THC, CBD, Mixture of cannabinoids  Substance and Sexual Activity  . Alcohol use: Yes    Alcohol/week: 12.0 standard drinks    Types: 12 Cans of beer per week   . Drug use: Not Currently    Types: Hydrocodone    Comment: not prescribed- previously  . Sexual activity: Not Currently  Other Topics Concern  . Not on file  Social History Narrative  . Not on file   Social Determinants of Health   Financial Resource Strain: Not on file  Food Insecurity: Not on file  Transportation Needs: Not on file  Physical Activity: Not on file  Stress: Not on file  Social Connections: Not on file    Allergies:  Allergies  Allergen Reactions  . Morphine And Related Swelling    Metabolic Disorder Labs: No results found for: HGBA1C, MPG No results found for: PROLACTIN No results found for: CHOL, TRIG, HDL, CHOLHDL, VLDL, LDLCALC No results found for: TSH  Therapeutic Level Labs: No results found for: LITHIUM No results found for: VALPROATE No components found for:  CBMZ  Current Medications: Current Outpatient Medications  Medication Sig Dispense Refill  . buPROPion (WELLBUTRIN XL) 150 MG 24 hr tablet Take 1 tablet (150 mg total) by mouth every morning. 30 tablet 2  . lamoTRIgine (LAMICTAL) 25 MG tablet Take 3 tablets (75 mg total) by mouth daily. 90 tablet 2   No current facility-administered medications for this visit.     Musculoskeletal: Strength & Muscle Tone: Unable to assess due to telemedicine visit Gait & Station: Unable to assess due to telemedicine visit Patient leans: Unable to assess due to telemedicine visit  Psychiatric Specialty Exam: Review of Systems  Psychiatric/Behavioral: Negative for dysphoric mood, hallucinations, sleep disturbance and suicidal ideas. The patient is not nervous/anxious and is not hyperactive.     There were no vitals taken for this visit.There is no height or weight on file to calculate BMI.  General Appearance: Unable to assess due to telemedicine visit  Eye Contact:  Unable to assess due to telemedicine visit  Speech:  Clear and Coherent and Normal Rate  Volume:  Normal  Mood:  Euthymic   Affect:  Appropriate  Thought Process:  Coherent and Descriptions of Associations: Intact  Orientation:  Full (Time, Place, and Person)  Thought Content: WDL and Logical   Suicidal Thoughts:  No  Homicidal Thoughts:  No  Memory:  Immediate;   Good Recent;   Good Remote;   Good  Judgement:  Good  Insight:  Good  Psychomotor Activity:  Normal  Concentration:  Concentration: Good and Attention Span: Good  Recall:  Good  Fund of Knowledge: Good  Language: Good  Akathisia:  NA  Handed:  Right  AIMS (if indicated): not done  Assets:  Communication Skills Desire for Improvement Financial Resources/Insurance Housing Social Support Vocational/Educational  ADL's:  Intact  Cognition: WNL  Sleep:  Fair   Screenings: PHQ2-9   Flowsheet Row Video Visit from 09/24/2019 in [facility] [facility] Visit from 05/26/2019 in [facility] [facility] Visit from 01/26/2016 in Primary Care at [facility] Total Score 4 2 0  PHQ-9  Total Score 7 6 -       Assessment and Plan:   Jeffrey Mooney is a 35 year old male with a past psychiatric history significant for bipolar disorder who presents to [facility] via virtual telephone visit for follow-up and medication management. Patient reports no issues or concerns regarding his medication regimen. Patient reports that the only issue that he has is that he is experiencing a decreased sex drive. Other than decreased interest in sex, patient reports that he feels happy the majority of the day. Patient states that the combination of medications are working great.  Patient's medications will be e-prescribed to pharmacy of choice.  Patient was advised to talk with PCP about decreased sex drive as the medications that the patient is currently taking are not known to cause sexual dysfunction.  1. Bipolar affective disorder, current episode mixed, current  episode severity unspecified (HCC) Patient to continue taking lamotrigine 75 mg (3 tablets, 25 mg)  - buPROPion (WELLBUTRIN XL) 150 MG 24 hr tablet; Take 1 tablet (150 mg total) by mouth every morning.  Dispense: 30 tablet; Refill: 2  Patient to follow up in 6 weeks  Jeffrey Hatchet, PA 03/30/2020, 9:30 PM

## 2020-04-04 ENCOUNTER — Telehealth (HOSPITAL_COMMUNITY): Payer: Self-pay

## 2020-04-04 NOTE — Telephone Encounter (Signed)
Received a fax from pharmacy requesting a refill on patient's Wellbutrin XL 150mg  to be sent to [facility] pharmacy on [address] Last sent in on 11/2 #30 2 refills. Followup appointment scheduled for 05/11/20. Thank you

## 2020-04-05 NOTE — Telephone Encounter (Signed)
Provider was contacted by Jeffrey Mooney, CMA for refill request. Patient's medication was called into pharmacy of choice.

## 2020-04-08 MED ORDER — BUPROPION HCL ER (XL) 150 MG PO TB24: 150.0000 mg | ORAL_TABLET | ORAL | 2 refills | Status: DC

## 2020-04-13 ENCOUNTER — Telehealth (HOSPITAL_COMMUNITY): Payer: Self-pay | Admitting: *Deleted

## 2020-04-13 ENCOUNTER — Other Ambulatory Visit (HOSPITAL_COMMUNITY): Payer: Self-pay | Admitting: Physician Assistant

## 2020-04-13 DIAGNOSIS — F3160 Bipolar disorder, current episode mixed, unspecified: Secondary | ICD-10-CM

## 2020-04-13 MED ORDER — LAMOTRIGINE 25 MG PO TABS: 75.0000 mg | ORAL_TABLET | Freq: Every day | ORAL | 2 refills | Status: DC

## 2020-04-13 NOTE — Progress Notes (Signed)
Provider was contacted by Rushie Chestnut, RMA regarding patient's medication. Patient's medications will be e-prescribed to pharmacy of choice.

## 2020-04-13 NOTE — Telephone Encounter (Signed)
Provider was contacted by Direce E McIntyre, RMA regarding patient's medication. Patient's medications will be e-prescribed to pharmacy of choice. 

## 2020-04-13 NOTE — Telephone Encounter (Signed)
Rx REFILL REQUEST  lamoTRIgine (LAMICTAL) 25 MG tablet 90 tablet 2 01/12/2020

## 2020-05-11 ENCOUNTER — Telehealth (INDEPENDENT_AMBULATORY_CARE_PROVIDER_SITE_OTHER): Payer: 59 | Admitting: Physician Assistant

## 2020-05-11 ENCOUNTER — Other Ambulatory Visit: Payer: Self-pay

## 2020-05-11 ENCOUNTER — Encounter (HOSPITAL_COMMUNITY): Payer: Self-pay | Admitting: Physician Assistant

## 2020-05-11 DIAGNOSIS — F411 Generalized anxiety disorder: Secondary | ICD-10-CM | POA: Diagnosis not present

## 2020-05-11 DIAGNOSIS — F3160 Bipolar disorder, current episode mixed, unspecified: Secondary | ICD-10-CM | POA: Insufficient documentation

## 2020-05-11 NOTE — Progress Notes (Signed)
BH MD/PA/NP OP Progress Note  Virtual Visit via Telephone Note  I connected with Jeffrey Mooney on 05/11/20 at  2:00 PM EST by telephone and verified that I am speaking with the correct person using two identifiers.  Location: Patient: Home Provider: Clinic   I discussed the limitations, risks, security and privacy concerns of performing an evaluation and management service by telephone and the availability of in person appointments. I also discussed with the patient that there may be a patient responsible charge related to this service. The patient expressed understanding and agreed to proceed.  Follow Up Instructions:   I discussed the assessment and treatment plan with the patient. The patient was provided an opportunity to ask questions and all were answered. The patient agreed with the plan and demonstrated an understanding of the instructions.   The patient was advised to call back or seek an in-person evaluation if the symptoms worsen or if the condition fails to improve as anticipated.  I provided 19 minutes of non-face-to-face time during this encounter.  Meta Hatchet, PA   05/11/2020 12:45 PM Jeffrey Mooney  MRN:  [id number]  Chief Complaint: Follow up and medication management  HPI:   Jeffrey Mooney is a 35 year old male with a past psychiatric history significant for bipolar disorder who presents to [facility] via virtual telephone visit for follow-up and medication management.  Patient is currently being managed on the following medications:  Bupropion 150 mg 24-hour tablet daily Lamictal 25 mg (3 tablets 75 mg total)  Patient reports no issues or concerns regarding his current medication regimen.  Patient denies the need for dosage adjustments at this time and is not requesting any medication refills.  Patient still reports that he has been having issues with his sex drive.  Patient denies being placed on any other medications at  this time.  Patient states that he only started having issues with his sex drive when he started his bupropion and Lamictal.  Patient was informed that his medications are not known to cause sexual dysfunction.  Patient was offered the option of changing his medications if he feels that his current regimen is the cause for his sexual dysfunction.  Patient reports that he would like to remain on his medications.  Patient is pleasant, cooperative, and fully engaged in conversation during the encounter.  In terms of mood, patient expresses that his mood is fairly level with good days and bad and that he often feels like he is on "autopilot." Patient denies suicidal ideations.  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  Patient denies auditory or visual hallucinations.  Patient endorses good sleep and receives on average 6 hours of sleep each night.  Patient endorses fair appetite and states that he does not follow any strict diets or meal plans.  Patient reports that he has a habit of just eating throughout the day when needed.  Patient endorses alcohol consumption.  When asked how much alcohol he drinks, patient stated that he drinks a bunch. Patient denies tobacco use and illicit drug use.  Visit Diagnosis: No diagnosis found.  Past Psychiatric History: Bipolar affective disorder, current episode mixed  Past Medical History:  Past Medical History:  Diagnosis Date  . Depression   . History of chickenpox     Past Surgical History:  Procedure Laterality Date  .  JOINT REPLACEMENT Right 2008   Right knee - torn meniscus    Family Psychiatric History:  No family history of psychiatric illness  Family History:  Family History  Problem Relation Age of Onset  . Healthy Mother   . Healthy Father   . Healthy Sister   . Healthy Sister   . Lung cancer Maternal  Grandmother     Social History:  Social History   Socioeconomic History  . Marital status: Married    Spouse name: Not on file  . Number of children: Not on file  . Years of education: Not on file  . Highest education level: Not on file  Occupational History  . Not on file  Tobacco Use  . Smoking status: Former Smoker    Types: Cigarettes    Quit date: 10/16/2012    Years since quitting: 7.5  . Smokeless tobacco: Never Used  Vaping Use  . Vaping Use: Former  . Substances: THC, CBD, Mixture of cannabinoids  Substance and Sexual Activity  . Alcohol use: Yes    Alcohol/week: 12.0 standard drinks    Types: 12 Cans of beer per week  . Drug use: Not Currently    Types: Hydrocodone    Comment: not prescribed- previously  . Sexual activity: Not Currently  Other Topics Concern  . Not on file  Social History Narrative  . Not on file   Social Determinants of Health   Financial Resource Strain: Not on file  Food Insecurity: Not on file  Transportation Needs: Not on file  Physical Activity: Not on file  Stress: Not on file  Social Connections: Not on file    Allergies:  Allergies  Allergen Reactions  . Morphine And Related Swelling    Metabolic Disorder Labs: No results found for: HGBA1C, MPG No results found for: PROLACTIN No results found for: CHOL, TRIG, HDL, CHOLHDL, VLDL, LDLCALC No results found for: TSH  Therapeutic Level Labs: No results found for: LITHIUM No results found for: VALPROATE No components found for:  CBMZ  Current Medications: Current Outpatient Medications  Medication Sig Dispense Refill  . buPROPion (WELLBUTRIN XL) 150 MG 24 hr tablet Take 1 tablet (150 mg total) by mouth every morning. 30 tablet 2  . lamoTRIgine (LAMICTAL) 25 MG tablet Take 3 tablets (75 mg total) by mouth daily. 90 tablet 2   No current facility-administered medications for this visit.     Musculoskeletal: Strength & Muscle Tone: Unable to assess due to telemedicine  visit Gait & Station: Unable to assess due to telemedicine visit Patient leans: Unable to assess due to telemedicine visit  Psychiatric Specialty Exam: Review of Systems  Psychiatric/Behavioral: Negative for decreased concentration, dysphoric mood, hallucinations, self-injury, sleep disturbance and suicidal ideas. The patient is not nervous/anxious and is not hyperactive.     There were no vitals taken for this visit.There is no height or weight on file to calculate BMI.  General Appearance: Unable to assess due to telemedicine visit  Eye Contact:  Unable to assess due to telemedicine visit  Speech:  Clear and Coherent and Normal Rate  Volume:  Normal  Mood:  Euthymic and Irritable  Affect:  Appropriate  Thought Process:  Coherent and Descriptions of Associations: Intact  Orientation:  Full (Time, Place, and Person)  Thought Content: WDL and Logical   Suicidal Thoughts:  No  Homicidal Thoughts:  No  Memory:  Immediate;   Good Recent;   Good Remote;   Good  Judgement:  Good  Insight:  Fair  Psychomotor Activity:  Normal  Concentration:  Concentration: Good and Attention Span: Good  Recall:  Good  Fund of Knowledge: Good  Language: Good  Akathisia:  NA  Handed:  Right  AIMS (if indicated): not done  Assets:  Communication Skills Desire for Improvement Financial Resources/Insurance Housing Social Support Vocational/Educational  ADL's:  Intact  Cognition: WNL  Sleep:  Good   Screenings: GAD-7   Flowsheet Row Video Visit from 05/11/2020 in [facility]  Total GAD-7 Score 8    PHQ2-9   Flowsheet Row Video Visit from 05/11/2020 in [facility] Video Visit from 09/24/2019 in [facility] [facility] Visit from 05/26/2019 in [facility] [facility] Visit from 01/26/2016 in Primary Care at [facility] Total Score 1 4 2  0  PHQ-9 Total Score -- 7 6 --     Flowsheet Row Video Visit [facility] 05/11/2020 in [facility]  C-SSRS RISK CATEGORY No Risk       Assessment and Plan:   Rhylee Nunn is a 35 year old male with a past psychiatric history significant for bipolar disorder who presents to [facility] via virtual telephone visit for follow-up and medication management. Patient reports no issues or concerns regarding his current medication regimen.  Patient states that he is still having issues with his sex drive and states that he only ever started having problems when he was placed on his current regimen of medications.  Patient was advised that his current regimen of medications were not known to cause sexual dysfunction.  Patient states that he would like to remain on his current medications.  Patient was advised to limit his alcohol consumption since alcohol has been shown to decrease libido and impair sexual function.  Patient was receptive to advice.  Patient denies the need for refills on his medications.  1. Bipolar affective disorder, current episode mixed, current episode severity unspecified (HCC) Patient to continue taking bupropion 150 mg 24-hour tablet daily for the management of his bipolar affective disorder Patient to continue taking Lamictal 75 mg daily for the management of his bipolar affective disorder  2. Generalized anxiety disorder  Patient to follow up in 2 months  [facility], PA 05/11/2020, 12:45 PM

## 2020-05-16 NOTE — Progress Notes (Signed)
Jeffrey Mooney is a 35 y.o. male client who was scheduled for a virtual CCA with this Clinical research associate. Virtual session was interrupted when virtual call dropped/bad connection.        Mamie Nick, Counselor

## 2020-07-12 ENCOUNTER — Telehealth (HOSPITAL_COMMUNITY): Payer: Self-pay | Admitting: *Deleted

## 2020-07-12 ENCOUNTER — Other Ambulatory Visit (HOSPITAL_COMMUNITY): Payer: Self-pay | Admitting: Psychiatry

## 2020-07-12 ENCOUNTER — Telehealth (HOSPITAL_COMMUNITY): Payer: 59 | Admitting: Physician Assistant

## 2020-07-12 DIAGNOSIS — F3160 Bipolar disorder, current episode mixed, unspecified: Secondary | ICD-10-CM

## 2020-07-12 MED ORDER — LAMOTRIGINE 25 MG PO TABS: 75.0000 mg | ORAL_TABLET | Freq: Every day | ORAL | 1 refills | Status: DC

## 2020-07-12 NOTE — Telephone Encounter (Signed)
Medications refilled and sent to preferred pharmacy.

## 2020-07-12 NOTE — Telephone Encounter (Signed)
Rx refill request :lamoTRIgine (LAMICTAL) 25 MG tablet

## 2020-07-29 ENCOUNTER — Other Ambulatory Visit: Payer: Self-pay

## 2020-07-29 ENCOUNTER — Encounter (HOSPITAL_COMMUNITY): Payer: Self-pay | Admitting: Physician Assistant

## 2020-07-29 ENCOUNTER — Telehealth (INDEPENDENT_AMBULATORY_CARE_PROVIDER_SITE_OTHER): Payer: 59 | Admitting: Physician Assistant

## 2020-07-29 DIAGNOSIS — F3160 Bipolar disorder, current episode mixed, unspecified: Secondary | ICD-10-CM

## 2020-07-29 DIAGNOSIS — F411 Generalized anxiety disorder: Secondary | ICD-10-CM

## 2020-07-29 MED ORDER — BUPROPION HCL ER (XL) 300 MG PO TB24: 300.0000 mg | ORAL_TABLET | ORAL | 1 refills | Status: DC

## 2020-07-29 MED ORDER — LAMOTRIGINE 25 MG PO TABS: 75.0000 mg | ORAL_TABLET | Freq: Every day | ORAL | 1 refills | Status: DC

## 2020-07-29 NOTE — Progress Notes (Signed)
BH MD/PA/NP OP Progress Note  Virtual Visit via Telephone Note  I connected with Jeffrey Mooney on 07/29/20 at  1:30 PM EDT by telephone and verified that I am speaking with the correct person using two identifiers.  Location: Patient: Home Provider: Clinic   I discussed the limitations, risks, security and privacy concerns of performing an evaluation and management service by telephone and the availability of in person appointments. I also discussed with the patient that there may be a patient responsible charge related to this service. The patient expressed understanding and agreed to proceed.  Follow Up Instructions:   I discussed the assessment and treatment plan with the patient. The patient was provided an opportunity to ask questions and all were answered. The patient agreed with the plan and demonstrated an understanding of the instructions.   The patient was advised to call back or seek an in-person evaluation if the symptoms worsen or if the condition fails to improve as anticipated.  I provided 17 minutes of non-face-to-face time during this encounter.  Jeffrey Hatchet, PA   07/29/2020 2:23 PM Jeffrey Mooney  MRN:  [id number]  Chief Complaint: Follow up and medication management  HPI:   Jeffrey Mooney is a 35 year old male with a past psychiatric history significant for bipolar disorder and generalized anxiety disorder who presents to [facility] via virtual telephone visit for follow-up and medication management.  Patient is currently being managed on the following medications:  Bupropion (Wellbutrin XL) 150 mg 24-hour tablet daily Lamictal 75 mg daily  Patient reports that he has been miserable for weeks.  Patient reports that he has experienced a resurgence in his depressive symptoms and that he feels like he is on autopilot most days.  Patient endorses the following depressive symptoms: inactivity, lack of motivation,  self-isolation, and fatigue.  Patient endorses taking his medications as prescribed.  Patient denies any new stressors or life changing events and denies any other symptoms currently.  Patient is interested in adjusting his dosage of bupropion 150 mg daily.  A PHQ-9 screen was performed with the patient scoring a 15.  A GAD-7 screen was also performed with the patient scoring a 12.  Patient is calm, cooperative, and fully engaged in conversation during the encounter.  Patient states that he feels miserable and on edge.  Patient denies suicidal or homicidal ideations and he further denies auditory or visual hallucinations and does not appear to be responding to internal/external stimuli.  Patient endorses fluctuating sleep that varies in the amount of hours he receives each at night.  Patient endorses receiving between 5 and 8 hours of sleep a night. patient endorses decreased appetite and eats on average 1-2 meals per day.  Patient endorses excessive alcohol consumption and states that he has 6-10 drinks a day.  Patient denies tobacco use and illicit drug use.  Visit Diagnosis:    ICD-10-CM   1. Generalized anxiety disorder  F41.1   2. Bipolar affective disorder, current episode mixed, current episode severity unspecified (HCC)  F31.60 buPROPion (WELLBUTRIN XL) 300 MG 24 hr tablet    lamoTRIgine (LAMICTAL) 25 MG tablet    Past Psychiatric History:  Bipolar affective disorder, current episode mixed Generalized anxiety disorder  Past Medical History:  Past Medical History:  Diagnosis Date  . Depression   . History of chickenpox     Past Surgical History:  Procedure Laterality Date  . JOINT REPLACEMENT Right 2008   Right knee - torn meniscus  Family Psychiatric History:  No family history of psychiatric illness  Family History:  Family History  Problem Relation Age of Onset  . Healthy Mother   . Healthy Father   . Healthy Sister   . Healthy Sister   . Lung cancer Maternal  Grandmother     Social History:  Social History   Socioeconomic History  . Marital status: Married    Spouse name: Not on file  . Number of children: Not on file  . Years of education: Not on file  . Highest education level: Not on file  Occupational History  . Not on file  Tobacco Use  . Smoking status: Former Smoker    Types: Cigarettes    Quit date: 10/16/2012    Years since quitting: 7.7  . Smokeless tobacco: Never Used  Vaping Use  . Vaping Use: Former  . Substances: THC, CBD, Mixture of cannabinoids  Substance and Sexual Activity  . Alcohol use: Yes    Alcohol/week: 12.0 standard drinks    Types: 12 Cans of beer per week  . Drug use: Not Currently    Types: Hydrocodone    Comment: not prescribed- previously  . Sexual activity: Not Currently  Other Topics Concern  . Not on file  Social History Narrative  . Not on file   Social Determinants of Health   Financial Resource Strain: Not on file  Food Insecurity: Not on file  Transportation Needs: Not on file  Physical Activity: Not on file  Stress: Not on file  Social Connections: Not on file    Allergies:  Allergies  Allergen Reactions  . Morphine And Related Swelling    Metabolic Disorder Labs: No results found for: HGBA1C, MPG No results found for: PROLACTIN No results found for: CHOL, TRIG, HDL, CHOLHDL, VLDL, LDLCALC No results found for: TSH  Therapeutic Level Labs: No results found for: LITHIUM No results found for: VALPROATE No components found for:  CBMZ  Current Medications: Current Outpatient Medications  Medication Sig Dispense Refill  . buPROPion (WELLBUTRIN XL) 300 MG 24 hr tablet Take 1 tablet (300 mg total) by mouth every morning. 30 tablet 1  . lamoTRIgine (LAMICTAL) 25 MG tablet Take 3 tablets (75 mg total) by mouth daily. 90 tablet 1   No current facility-administered medications for this visit.     Musculoskeletal: Strength & Muscle Tone: Unable to assess due to telemedicine  visit Gait & Station: Unable to assess due to telemedicine visit Patient leans: Unable to assess due to telemedicine visit  Psychiatric Specialty Exam: Review of Systems  Psychiatric/Behavioral: Positive for dysphoric mood and sleep disturbance. Negative for decreased concentration, hallucinations, self-injury and suicidal ideas. The patient is nervous/anxious. The patient is not hyperactive.     There were no vitals taken for this visit.There is no height or weight on file to calculate BMI.  General Appearance: Unable to assess due to telemedicine visit  Eye Contact:  Unable to assess due to telemedicine visit  Speech:  Clear and Coherent and Normal Rate  Volume:  Normal  Mood:  Anxious, Depressed and Dysphoric  Affect:  Congruent and Depressed  Thought Process:  Coherent, Goal Directed and Descriptions of Associations: Intact  Orientation:  Full (Time, Place, and Person)  Thought Content: WDL   Suicidal Thoughts:  No  Homicidal Thoughts:  No  Memory:  Immediate;   Good Recent;   Good Remote;   Good  Judgement:  Good  Insight:  Fair  Psychomotor Activity:  Normal  Concentration:  Concentration: Good and Attention Span: Good  Recall:  Good  Fund of Knowledge: Good  Language: Good  Akathisia:  NA  Handed:  Right  AIMS (if indicated): not done  Assets:  Communication Skills Desire for Improvement Financial Resources/Insurance Housing Social Support Vocational/Educational  ADL's:  Intact  Cognition: WNL  Sleep:  Fair   Screenings: GAD-7   Flowsheet Row [facility] from 07/29/2020 in [facility] Video Visit from 05/11/2020 in [facility]  Total GAD-7 Score 12 8    PHQ2-9   Flowsheet Row Video Visit from 07/29/2020 in [facility] Video Visit from 05[facility]11/2020 in [facility] Video Visit from 09/24/2019 in [facility] Visit  from 05/26/2019 in [facility] Visit from 01/26/2016 in [facility] Care at [facility] Total Score 4 1 4 2  0  PHQ-9 Total Score 15 -- 7 6 --    Flowsheet Row Video Visit from 07/29/2020 in [facility] Video Visit from 05/11/2020 in [facility]  C-SSRS RISK CATEGORY Low Risk No Risk       Assessment and Plan:   Edis Huish is a 35 year old male with a past psychiatric history significant for bipolar disorder and generalized anxiety disorder who presents to [facility] via virtual telephone visit for follow-up and medication management.  Patient reports that he has been feeling miserable as of late and endorses the following depressive symptoms: inactivity, lack of motivation, self-isolation, and fatigue.  Patient is interested in increasing his dosage of Wellbutrin.  Patient was recommended increasing his dosage of bupropion from 150 mg to 300 mg daily.  Patient was agreeable to recommendation.  Patient's medications to be e-prescribed to pharmacy of choice.  1. Bipolar affective disorder, current episode mixed, current episode severity unspecified (HCC)  - buPROPion (WELLBUTRIN XL) 300 MG 24 hr tablet; Take 1 tablet (300 mg total) by mouth every morning.  Dispense: 30 tablet; Refill: 1 - lamoTRIgine (LAMICTAL) 25 MG tablet; Take 3 tablets (75 mg total) by mouth daily.  Dispense: 90 tablet; Refill: 1  2. Generalized anxiety disorder  Patient to follow up in 2 months  [facility], PA 07/29/2020, 2:23 PM

## 2020-09-20 ENCOUNTER — Other Ambulatory Visit (HOSPITAL_COMMUNITY): Payer: Self-pay | Admitting: Psychiatry

## 2020-09-20 ENCOUNTER — Other Ambulatory Visit (HOSPITAL_COMMUNITY): Payer: Self-pay | Admitting: Physician Assistant

## 2020-09-20 ENCOUNTER — Telehealth (HOSPITAL_COMMUNITY): Payer: Self-pay | Admitting: *Deleted

## 2020-09-20 DIAGNOSIS — F3160 Bipolar disorder, current episode mixed, unspecified: Secondary | ICD-10-CM

## 2020-09-20 MED ORDER — BUPROPION HCL ER (XL) 300 MG PO TB24: 300.0000 mg | ORAL_TABLET | ORAL | 1 refills | Status: DC

## 2020-09-20 NOTE — Progress Notes (Signed)
Provider was contacted by Jeffrey Mooney regarding medication refill. Patient's medication to be e-prescribed to pharmacy of choice.

## 2020-09-20 NOTE — Telephone Encounter (Signed)
Rx Refill Request buPROPion (WELLBUTRIN XL) 300 MG 24 hr tablet

## 2020-09-20 NOTE — Telephone Encounter (Signed)
Provider was contacted by Jeffrey Mooney regarding medication refill. Patient's medication to be e-prescribed to pharmacy of choice.

## 2020-09-28 ENCOUNTER — Other Ambulatory Visit: Payer: Self-pay

## 2020-09-28 ENCOUNTER — Encounter (HOSPITAL_COMMUNITY): Payer: Self-pay | Admitting: Physician Assistant

## 2020-09-28 ENCOUNTER — Telehealth (INDEPENDENT_AMBULATORY_CARE_PROVIDER_SITE_OTHER): Payer: 59 | Admitting: Physician Assistant

## 2020-09-28 DIAGNOSIS — F3160 Bipolar disorder, current episode mixed, unspecified: Secondary | ICD-10-CM

## 2020-09-28 MED ORDER — BUPROPION HCL ER (XL) 300 MG PO TB24: 300.0000 mg | ORAL_TABLET | ORAL | 1 refills | Status: DC

## 2020-09-28 NOTE — Progress Notes (Signed)
BH MD/PA/NP OP Progress Note  Virtual Visit via Telephone Note  I connected with Jeffrey Mooney on 09/28/20 at  9:00 AM EDT by telephone and verified that I am speaking with the correct person using two identifiers.  Location: Patient: Home Provider: Clinic   I discussed the limitations, risks, security and privacy concerns of performing an evaluation and management service by telephone and the availability of in person appointments. I also discussed with the patient that there may be a patient responsible charge related to this service. The patient expressed understanding and agreed to proceed.  Follow Up Instructions:  I discussed the assessment and treatment plan with the patient. The patient was provided an opportunity to ask questions and all were answered. The patient agreed with the plan and demonstrated an understanding of the instructions.   The patient was advised to call back or seek an in-person evaluation if the symptoms worsen or if the condition fails to improve as anticipated.  I provided 15 minutes of non-face-to-face time during this encounter.  Meta Hatchet, PA   09/28/2020 6:10 PM Jeffrey Mooney  MRN:  [id number]  Chief Complaint: Follow up and medication management  HPI:   Jeffrey Mooney is a 35 year old male with a past psychiatric history significant for bipolar disorder and generalized anxiety disorder who presents to [facility] via virtual telephone visit for follow-up and medication management.  Patient is currently being managed on the following medications:  Bupropion (Wellbutrin XL) 150 mg 24-hour tablet daily Lamictal 75 mg daily for  Patient reports no issues with his current medication regimen.  Patient denies the need for dosage adjustments at this time and is requesting refills on his Wellbutrin following the conclusion of the encounter.  Patient states that his medications have been working well and that he  is doing okay.  Patient reports that he has started taking Wellbutrin at night due to feeling like a zombie when taking it during the daytime.  Patient states that his depressive symptoms have been tolerable and that he has not experienced much anxiety.  Patient denies any new stressors or major events.  A PHQ-9 screen was performed with the patient scoring a 15 and a GAD-7 screen was also performed with the patient scoring a 12  Patient is pleasant, calm, cooperative, and fully engaged in conversation during the encounter.  Patient reports that his mood is okay.  Patient denies suicidal or homicidal ideations.  He further denies suicidal or homicidal ideations and does not appear to be responding to internal/external stimuli.  Patient endorses good sleep and receives on average 6 to 7 hours of sleep each night.  Patient endorses good appetite and eats on average 1-2 meals per day.  Patient endorses alcohol consumption but states that he has cut down dramatically the amount of alcohol he consumes.  Patient denies tobacco use and illicit drug use.  Visit Diagnosis:    ICD-10-CM   1. Bipolar affective disorder, current episode mixed, current episode severity unspecified (HCC)  F31.60 buPROPion (WELLBUTRIN XL) 300 MG 24 hr tablet      Past Psychiatric History:  Bipolar affective disorder, current episode mixed Generalized anxiety disorder  Past Medical History:  Past Medical History:  Diagnosis Date   Depression    History of chickenpox     Past Surgical History:  Procedure Laterality Date   JOINT REPLACEMENT Right 2008   Right knee - torn meniscus    Family Psychiatric History:  No family history  of psychiatric illness  Family History:  Family History  Problem Relation Age of Onset   Healthy Mother    Healthy Father    Healthy Sister    Healthy Sister    Lung cancer Maternal Grandmother     Social History:  Social History   Socioeconomic History   Marital status: Married     Spouse name: Not on file   Number of children: Not on file   Years of education: Not on file   Highest education level: Not on file  Occupational History   Not on file  Tobacco Use   Smoking status: Former    Types: Cigarettes    Quit date: 10/16/2012    Years since quitting: 7.9   Smokeless tobacco: Never  Vaping Use   Vaping Use: Former   Substances: THC, CBD, Mixture of cannabinoids  Substance and Sexual Activity   Alcohol use: Yes    Alcohol/week: 12.0 standard drinks    Types: 12 Cans of beer per week   Drug use: Not Currently    Types: Hydrocodone    Comment: not prescribed- previously   Sexual activity: Not Currently  Other Topics Concern   Not on file  Social History Narrative   Not on file   Social Determinants of Health   Financial Resource Strain: Not on file  Food Insecurity: Not on file  Transportation Needs: Not on file  Physical Activity: Not on file  Stress: Not on file  Social Connections: Not on file    Allergies:  Allergies  Allergen Reactions   Morphine And Related Swelling    Metabolic Disorder Labs: No results found for: HGBA1C, MPG No results found for: PROLACTIN No results found for: CHOL, TRIG, HDL, CHOLHDL, VLDL, LDLCALC No results found for: TSH  Therapeutic Level Labs: No results found for: LITHIUM No results found for: VALPROATE No components found for:  CBMZ  Current Medications: Current Outpatient Medications  Medication Sig Dispense Refill   buPROPion (WELLBUTRIN XL) 300 MG 24 hr tablet Take 1 tablet (300 mg total) by mouth every morning. 30 tablet 1   lamoTRIgine (LAMICTAL) 25 MG tablet TAKE 3 TABLETS BY MOUTH DAILY 90 tablet 1   No current facility-administered medications for this visit.     Musculoskeletal: Strength & Muscle Tone: Unable to assess due to telemedicine visit Gait & Station: Unable to assess due to telemedicine visit Patient leans: Unable to assess due to telemedicine visit  Psychiatric Specialty  Exam: Review of Systems  Psychiatric/Behavioral:  Negative for decreased concentration, dysphoric mood, hallucinations, self-injury, sleep disturbance and suicidal ideas. The patient is nervous/anxious. The patient is not hyperactive.    There were no vitals taken for this visit.There is no height or weight on file to calculate BMI.  General Appearance: Unable to assess due to telemedicine visit  Eye Contact:  Unable to assess due to telemedicine visit  Speech:  Clear and Coherent and Normal Rate  Volume:  Normal  Mood:  Euthymic  Affect:  Appropriate  Thought Process:  Coherent and Descriptions of Associations: Intact  Orientation:  Full (Time, Place, and Person)  Thought Content: WDL   Suicidal Thoughts:  No  Homicidal Thoughts:  No  Memory:  Immediate;   Good Recent;   Good Remote;   Good  Judgement:  Good  Insight:  Good  Psychomotor Activity:  Normal  Concentration:  Concentration: Good and Attention Span: Good  Recall:  Good  Fund of Knowledge: Good  Language: Good  Akathisia:  NA  Handed:  Right  AIMS (if indicated): not done  Assets:  Communication Skills Desire for Improvement Housing Vocational/Educational  ADL's:  Intact  Cognition: WNL  Sleep:  Good   Screenings: GAD-7    Flowsheet Row Video Visit from 09/28/2020 in [facility] Video Visit from 07/29/2020 in [facility] Video Visit from 05/11/2020 in [facility]  Total GAD-7 Score 9 12 8       PHQ2-9    Flowsheet Row Video Visit from 09/28/2020 in [facility] Video Visit from 07/29/2020 in [facility] Video Visit from 05/11/2020 in [facility] Video Visit from 09/24/2019 in [facility] Visit from 05/26/2019 in [facility]  PHQ-2 Total Score 2 4 1 4 2   PHQ-9  Total Score 6 15 -- 7 6      Flowsheet Row Video Visit from 09/28/2020 in [facility] Video Visit from 07/29/2020 in [facility] Video Visit from 05/11/2020 in [facility]  C-SSRS RISK CATEGORY No Risk Low Risk No Risk        Assessment and Plan:   Jeffrey Mooney is a 35 year old male with a past psychiatric history significant for bipolar disorder and generalized anxiety disorder who presents to [facility] via virtual telephone visit for follow-up and medication management.  Patient denies any issues or concerns regarding his current medication regimen.  Patient is requesting refills on his Wellbutrin following the conclusion of the encounter.  Patient to continue taking medications as prescribed.  Patient's medication to be e-prescribed to pharmacy of choice.  1. Bipolar affective disorder, current episode mixed, current episode severity unspecified (HCC) Patient to continue taking Lamictal 75 mg daily for the management of his bipolar disorder  - buPROPion (WELLBUTRIN XL) 300 MG 24 hr tablet; Take 1 tablet (300 mg total) by mouth every morning.  Dispense: 30 tablet; Refill: 1  Patient to follow up in 2 months A total of 15 minutes was spent with the patient/reviewing patient's chart  31, PA 09/28/2020, 6:10 PM

## 2020-10-25 ENCOUNTER — Other Ambulatory Visit (HOSPITAL_COMMUNITY): Payer: Self-pay | Admitting: Psychiatry

## 2020-10-25 ENCOUNTER — Telehealth (HOSPITAL_COMMUNITY): Payer: Self-pay | Admitting: *Deleted

## 2020-10-25 DIAGNOSIS — F3160 Bipolar disorder, current episode mixed, unspecified: Secondary | ICD-10-CM

## 2020-10-25 NOTE — Telephone Encounter (Signed)
Pharmacy faxed request for him to have a 90 day supply of his Bupropion. He has a zero co pay no matter what his amount is, he requested the 90 day supply. Will request

## 2020-11-24 ENCOUNTER — Other Ambulatory Visit (HOSPITAL_COMMUNITY): Payer: Self-pay | Admitting: Physician Assistant

## 2020-11-24 DIAGNOSIS — F3160 Bipolar disorder, current episode mixed, unspecified: Secondary | ICD-10-CM

## 2020-12-06 ENCOUNTER — Telehealth (INDEPENDENT_AMBULATORY_CARE_PROVIDER_SITE_OTHER): Payer: 59 | Admitting: Physician Assistant

## 2020-12-06 ENCOUNTER — Encounter (HOSPITAL_COMMUNITY): Payer: Self-pay | Admitting: Physician Assistant

## 2020-12-06 DIAGNOSIS — F3160 Bipolar disorder, current episode mixed, unspecified: Secondary | ICD-10-CM | POA: Diagnosis not present

## 2020-12-06 MED ORDER — ARIPIPRAZOLE 2 MG PO TABS: 2.0000 mg | ORAL_TABLET | Freq: Every day | ORAL | 1 refills | Status: DC

## 2020-12-06 MED ORDER — ARIPIPRAZOLE 5 MG PO TABS: 5.0000 mg | ORAL_TABLET | Freq: Every day | ORAL | 1 refills | Status: DC

## 2020-12-06 NOTE — Progress Notes (Signed)
BH MD/PA/NP OP Progress Note  Virtual Visit via Telephone Note  I connected with Jeffrey Mooney on 12/08/20 at  8:30 AM EDT by telephone and verified that I am speaking with the correct person using two identifiers.  Location: Patient: Home Provider: Clinic   I discussed the limitations, risks, security and privacy concerns of performing an evaluation and management service by telephone and the availability of in person appointments. I also discussed with the patient that there may be a patient responsible charge related to this service. The patient expressed understanding and agreed to proceed.  Follow Up Instructions:   I discussed the assessment and treatment plan with the patient. The patient was provided an opportunity to ask questions and all were answered. The patient agreed with the plan and demonstrated an understanding of the instructions.   The patient was advised to call back or seek an in-person evaluation if the symptoms worsen or if the condition fails to improve as anticipated.  I provided 15 minutes of non-face-to-face time during this encounter.  Jeffrey Hatchet, PA    12/08/2020 11:31 PM Jeffrey Mooney  MRN:  [id number]  Chief Complaint: Follow up and medication management  HPI:   Jeffrey Mooney is a 35 year old male with a past psychiatric history significant for bipolar disorder and generalized anxiety disorder who presents to [facility] via virtual telephone visit for follow-up and medication management.  Patient is currently being managed on the following medications:  Bupropion (Wellbutrin XL) 300 mg 24-hour tablet daily Lamictal 75 mg daily  Patient reports that he has had no issues or concerns regarding his current medication regimen.  He does express that he recently ran out of his medications and was unable to reach out to the provider due to not knowing the provider's name at the time.  Patient endorses having  refills on his medications at this time.  Patient expresses that he is constantly feeling tired.  He expresses that the last couple of weeks have been very tough for him and he has never felt as low as he has had in the past.  Patient states he feels miserable and that he often experiences difficulty getting out of bed.  Patient also endorses anxiety but is unable to grade his anxiety level at this time.  Patient denies any new stressors at this time.  A PHQ-9 screen was performed with the patient scoring a 12.  A GAD-7 screen was also performed with the patient scoring a 9.  Patient is alert and oriented x4, calm, cooperative, and fully engaged in conversation during the encounter.  Patient describes his mood as "so-so."  Patient denies suicidal ideations but states that he often wants to hurt people that bother him.  Patient expresses that he would never act on these thoughts.  Patient denies auditory or visual hallucinations and does not appear to be responding to internal/external stimuli.  Patient endorses fair sleep and receives on average 5 to 8 hours of intermittent sleep.  Patient endorses decreased appetite and eats on average 1-2 meals per day.  Patient endorses excessive alcohol consumption and realizes that he needs to cut back on his alcohol consumption.  Patient denies tobacco use and illicit drug use.  Visit Diagnosis:    ICD-10-CM   1. Bipolar affective disorder, current episode mixed, current episode severity unspecified (HCC)  F31.60 ARIPiprazole (ABILIFY) 2 MG tablet    DISCONTINUED: ARIPiprazole (ABILIFY) 5 MG tablet      Past Psychiatric History:  Bipolar affective disorder, current episode mixed Generalized anxiety disorder  Past Medical History:  Past Medical History:  Diagnosis Date   Depression    History of chickenpox     Past Surgical History:  Procedure Laterality Date   JOINT REPLACEMENT Right 2008   Right knee - torn meniscus    Family Psychiatric History:   No family history of psychiatric illness  Family History:  Family History  Problem Relation Age of Onset   Healthy Mother    Healthy Father    Healthy Sister    Healthy Sister    Lung cancer Maternal Grandmother     Social History:  Social History   Socioeconomic History   Marital status: Married    Spouse name: Not on file   Number of children: Not on file   Years of education: Not on file   Highest education level: Not on file  Occupational History   Not on file  Tobacco Use   Smoking status: Former    Types: Cigarettes    Quit date: 10/16/2012    Years since quitting: 8.1   Smokeless tobacco: Never  Vaping Use   Vaping Use: Former   Substances: THC, CBD, Mixture of cannabinoids  Substance and Sexual Activity   Alcohol use: Yes    Alcohol/week: 12.0 standard drinks    Types: 12 Cans of beer per week   Drug use: Not Currently    Types: Hydrocodone    Comment: not prescribed- previously   Sexual activity: Not Currently  Other Topics Concern   Not on file  Social History Narrative   Not on file   Social Determinants of Health   Financial Resource Strain: Not on file  Food Insecurity: Not on file  Transportation Needs: Not on file  Physical Activity: Not on file  Stress: Not on file  Social Connections: Not on file    Allergies:  Allergies  Allergen Reactions   Morphine And Related Swelling    Metabolic Disorder Labs: No results found for: HGBA1C, MPG No results found for: PROLACTIN No results found for: CHOL, TRIG, HDL, CHOLHDL, VLDL, LDLCALC No results found for: TSH  Therapeutic Level Labs: No results found for: LITHIUM No results found for: VALPROATE No components found for:  CBMZ  Current Medications: Current Outpatient Medications  Medication Sig Dispense Refill   ARIPiprazole (ABILIFY) 2 MG tablet Take 1 tablet (2 mg total) by mouth daily. 30 tablet 1   buPROPion (WELLBUTRIN XL) 300 MG 24 hr tablet TAKE 1 TABLET (300 MG TOTAL) BY MOUTH  EVERY MORNING. 90 tablet 1   lamoTRIgine (LAMICTAL) 25 MG tablet TAKE 3 TABLETS BY MOUTH DAILY 90 tablet 1   No current facility-administered medications for this visit.     Musculoskeletal: Strength & Muscle Tone: Unable to assess due to telemedicine visit Gait & Station: Unable to assess due to telemedicine visit Patient leans: Unable to assess due to telemedicine visit  Psychiatric Specialty Exam: Review of Systems  Psychiatric/Behavioral:  Positive for agitation and sleep disturbance. Negative for decreased concentration, dysphoric mood, hallucinations, self-injury and suicidal ideas. The patient is nervous/anxious. The patient is not hyperactive.    There were no vitals taken for this visit.There is no height or weight on file to calculate BMI.  General Appearance: Unable to assess due to telemedicine visit  Eye Contact:  Unable to assess due to telemedicine visit  Speech:  Clear and Coherent and Normal Rate  Volume:  Normal  Mood:  Anxious, Depressed, and  Irritable  Affect:  Congruent and Depressed  Thought Process:  Coherent, Goal Directed, and Descriptions of Associations: Intact  Orientation:  Full (Time, Place, and Person)  Thought Content: WDL   Suicidal Thoughts:  No  Homicidal Thoughts:  No  Memory:  Immediate;   Good Recent;   Good Remote;   Good  Judgement:  Good  Insight:  Good and Fair  Psychomotor Activity:  Normal  Concentration:  Concentration: Good and Attention Span: Good  Recall:  Good  Fund of Knowledge: Good  Language: Good  Akathisia:  NA  Handed:  Right  AIMS (if indicated): not done  Assets:  Communication Skills Desire for Improvement Housing Vocational/Educational  ADL's:  Intact  Cognition: WNL  Sleep:  Poor   Screenings: GAD-7    Flowsheet Row Video Visit from 12/06/2020 in [facility] Video Visit from 09/28/2020 in [facility] Video Visit from 07/29/2020 in [facility] Video Visit from 05/11/2020 in [facility]  Total GAD-7 Score 9 9 12 8       PHQ2-9    Flowsheet Row Video Visit from 12/06/2020 in [facility] Video Visit from 09/28/2020 in [facility] Video Visit from 07/29/2020 in [facility] Video Visit from 05/11/2020 in [facility] Video Visit from 09/24/2019 in [facility]  PHQ-2 Total Score 2 2 4 1 4   PHQ-9 Total Score 12 6 15  -- 7      Flowsheet Row Video Visit from 12/06/2020 in [facility] Video Visit from 09/28/2020 in [facility] Video Visit from 07/29/2020 in [facility]  C-SSRS RISK CATEGORY No Risk No Risk Low Risk        Assessment and Plan:   Fahad Cisse is a 35 year old male with a past psychiatric history significant for bipolar disorder and generalized anxiety disorder who presents to [facility] via virtual telephone visit for follow-up and medication management.  Patient's main concern is his worsening depression characterized by decreased appetite, fatigue, low mood, and irritability.  Patient was recommended adding on Abilify 2 mg daily for the management of his worsening depression.  Patient was agreeable to recommendation.  Patient's medication to be e-prescribed to pharmacy of choice.  1. Bipolar affective disorder, current episode mixed, current episode severity unspecified (HCC) Patient to continue taking bupropion (Wellbutrin XL) 300 mg 24-hour tablet daily for the management of his bipolar disorder Patient to continue taking Lamictal 75 mg daily for the management of his bipolar disorder  - ARIPiprazole (ABILIFY) 5 MG tablet; Take 1 tablet (5 mg total) by mouth daily.  Dispense: 30 tablet; Refill:  1  Patient to follow up in 2 months Provider spent a total of 15 minutes with the patient/reviewing patient's chart  Jeffrey Dimitri, PA 12/08/2020, 11:31 PM

## 2020-12-16 ENCOUNTER — Other Ambulatory Visit (HOSPITAL_COMMUNITY): Payer: Self-pay | Admitting: Psychiatry

## 2020-12-16 DIAGNOSIS — F3160 Bipolar disorder, current episode mixed, unspecified: Secondary | ICD-10-CM

## 2020-12-20 ENCOUNTER — Other Ambulatory Visit (HOSPITAL_COMMUNITY): Payer: Self-pay | Admitting: Physician Assistant

## 2020-12-20 DIAGNOSIS — F3160 Bipolar disorder, current episode mixed, unspecified: Secondary | ICD-10-CM

## 2021-01-13 IMAGING — CR DG HIP (WITH OR WITHOUT PELVIS) 2-3V LEFT
4 series · 4 of 4 positions shown · non-contrast
Comparison: None.

CLINICAL DATA: Left hip pain

EXAM:
DG HIP (WITH OR WITHOUT PELVIS) 2-3V LEFT

[t pelvis ap (1 of 2)]
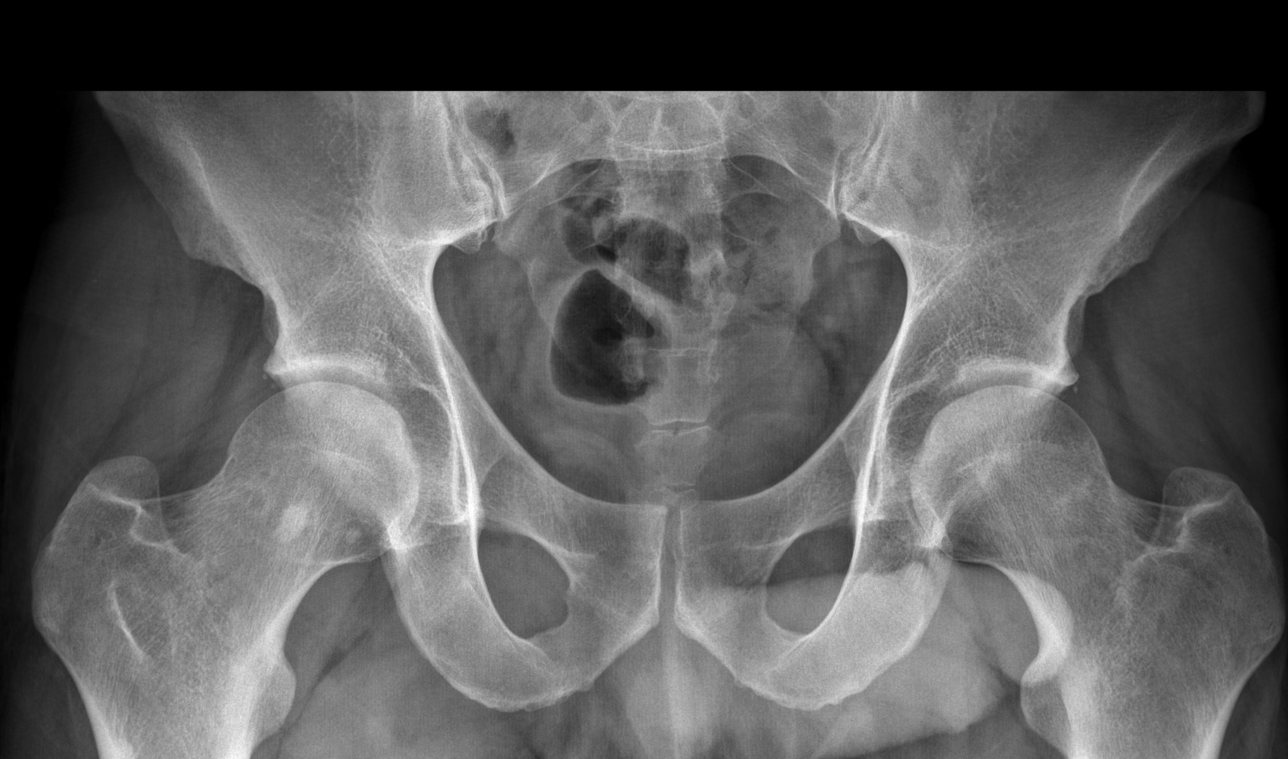

[t pelvis ap (2 of 2)]
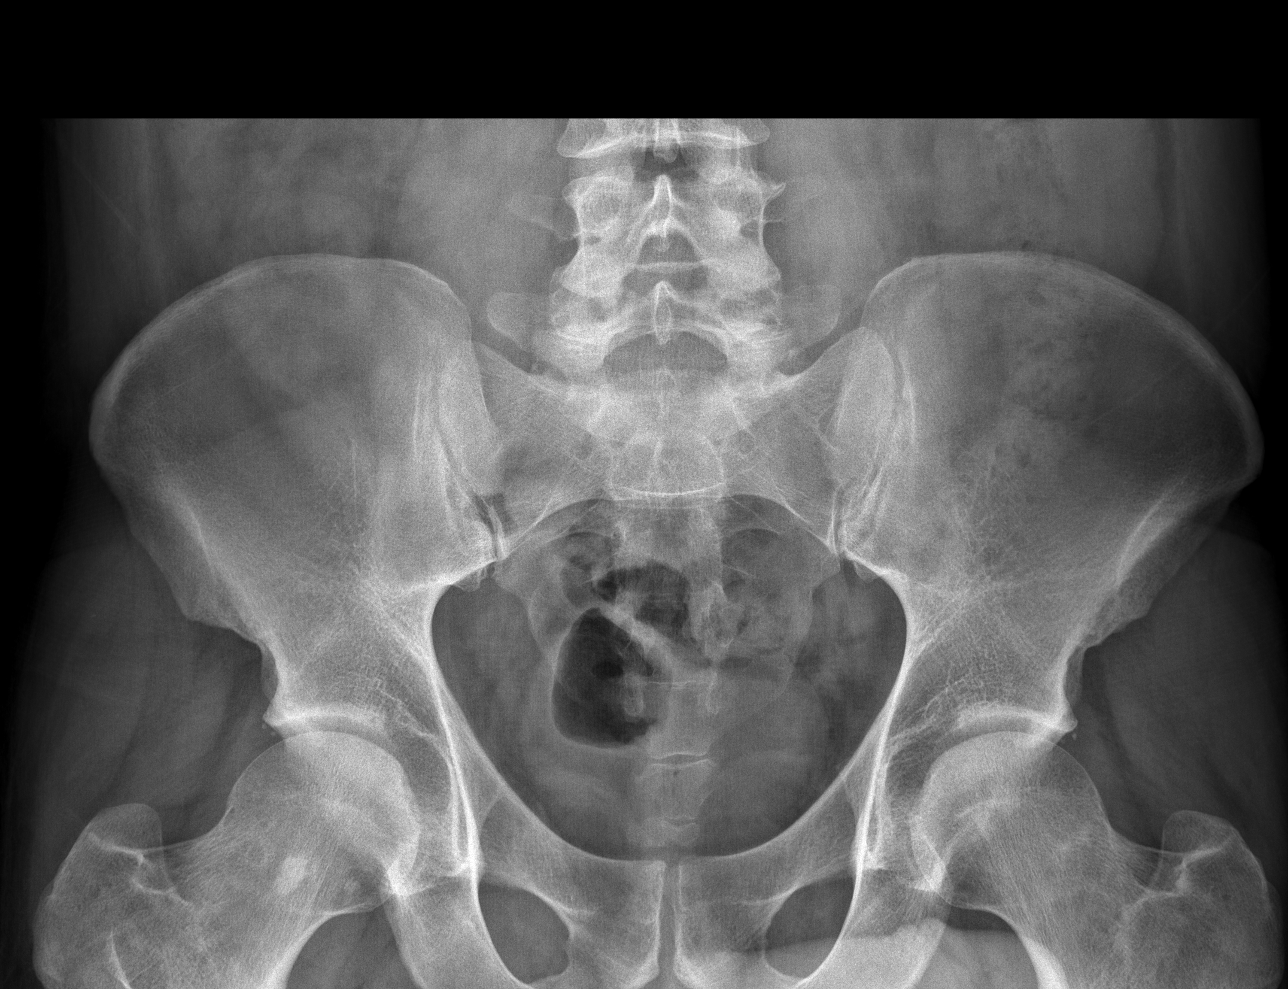

[t hip ap left]
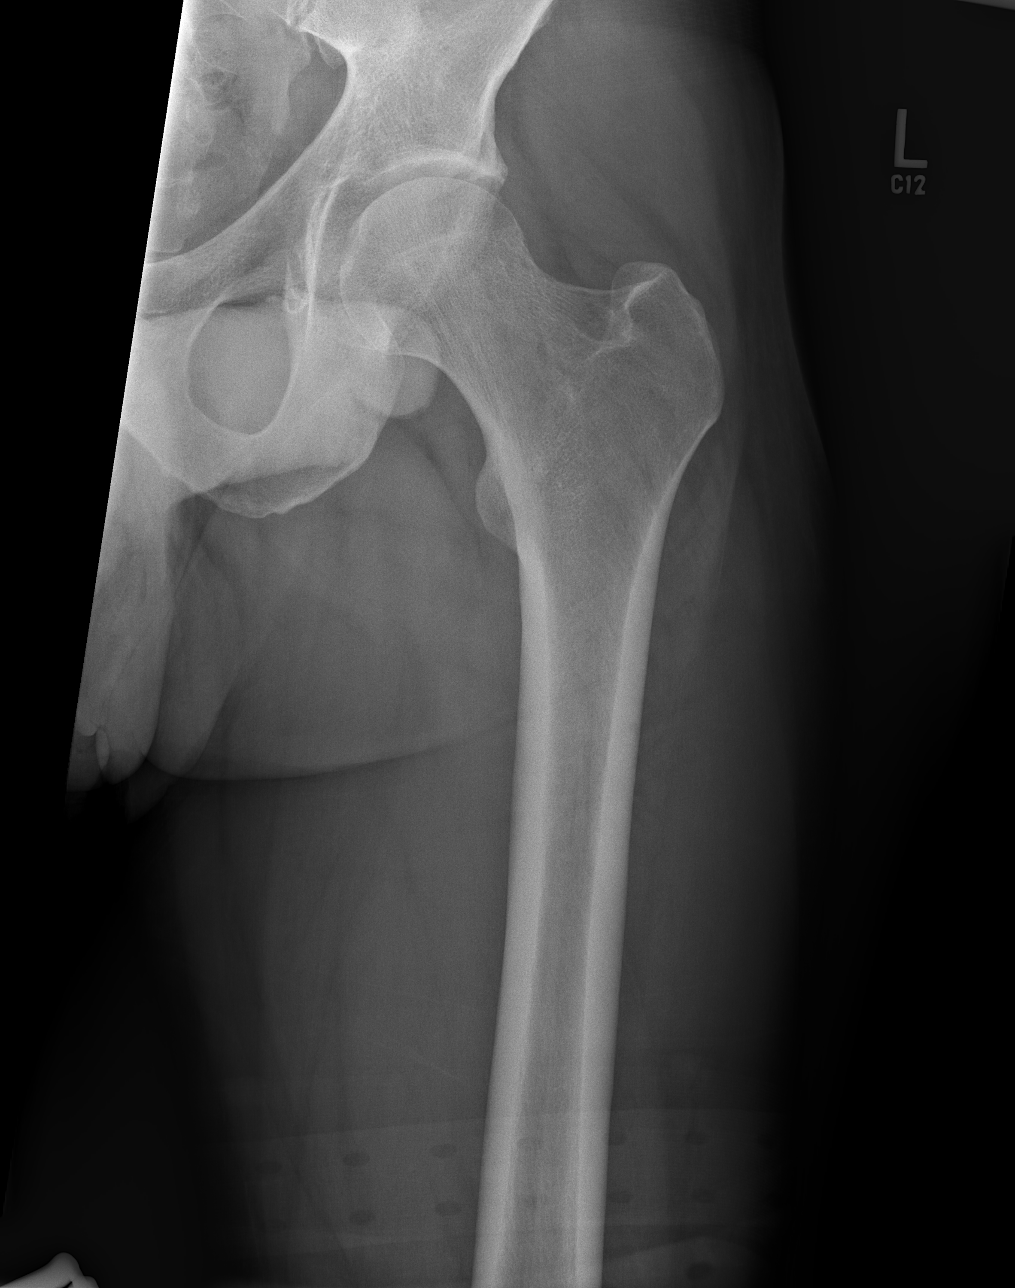

[t hip frog leg left]
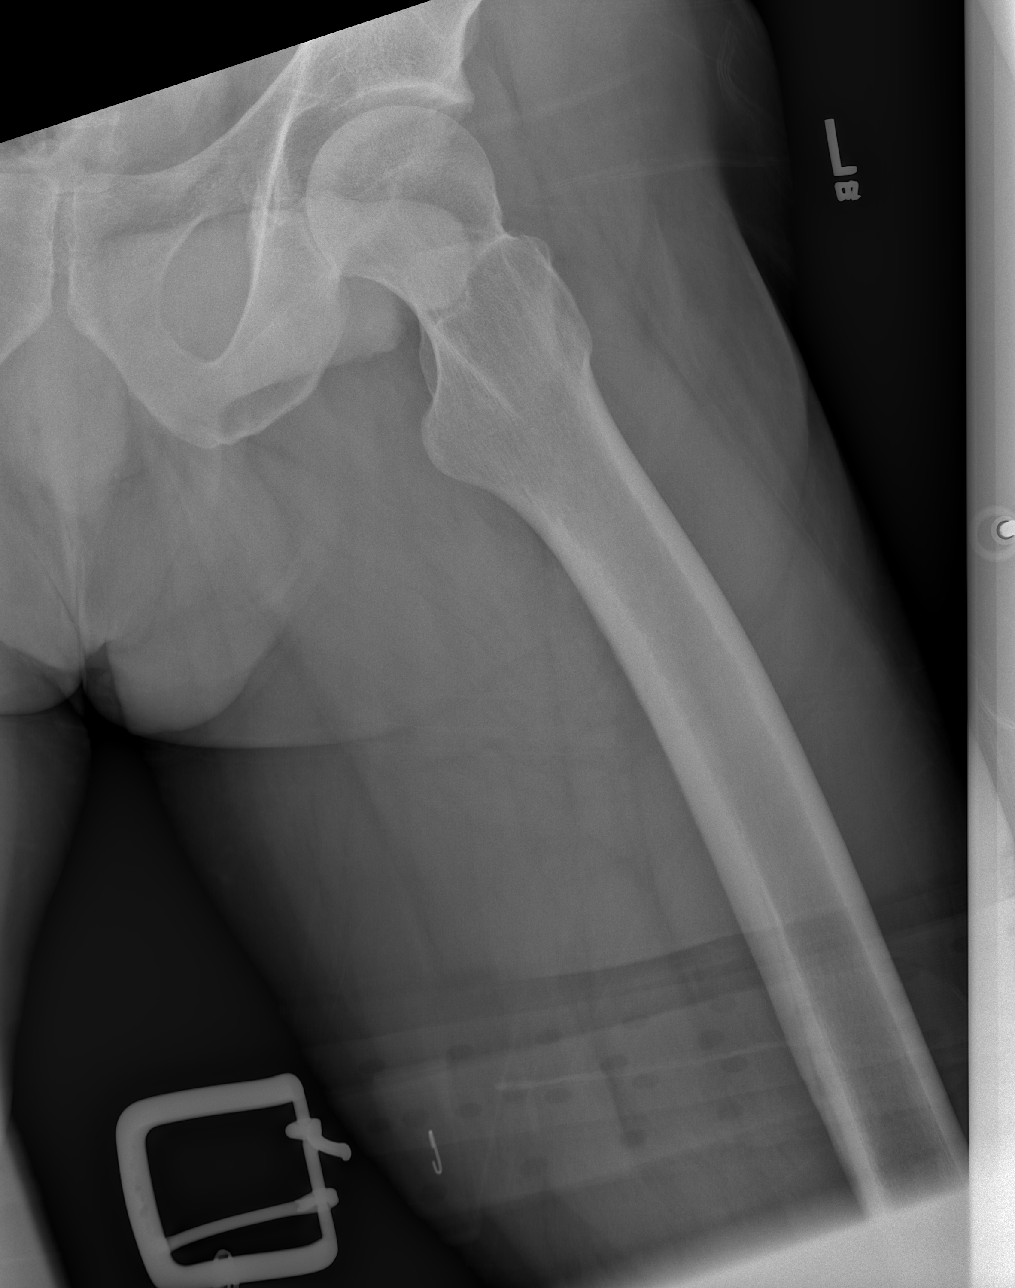

[4 of 4 positions shown; findings below may reference images not displayed]

FINDINGS: There is no evidence of hip fracture or dislocation. There is no
evidence of arthropathy or other focal bone abnormality. Incidental
note of a bone island in the right femoral neck.
IMPRESSION: No acute osseous abnormality, left hip.

## 2021-01-20 ENCOUNTER — Other Ambulatory Visit (HOSPITAL_COMMUNITY): Payer: Self-pay | Admitting: Physician Assistant

## 2021-01-20 DIAGNOSIS — F3160 Bipolar disorder, current episode mixed, unspecified: Secondary | ICD-10-CM

## 2021-01-30 ENCOUNTER — Other Ambulatory Visit (HOSPITAL_COMMUNITY): Payer: Self-pay | Admitting: Physician Assistant

## 2021-01-30 DIAGNOSIS — F3160 Bipolar disorder, current episode mixed, unspecified: Secondary | ICD-10-CM

## 2021-02-01 ENCOUNTER — Other Ambulatory Visit (HOSPITAL_COMMUNITY): Payer: Self-pay | Admitting: Physician Assistant

## 2021-02-01 DIAGNOSIS — F3160 Bipolar disorder, current episode mixed, unspecified: Secondary | ICD-10-CM

## 2021-02-01 MED ORDER — BUPROPION HCL ER (XL) 300 MG PO TB24: 300.0000 mg | ORAL_TABLET | ORAL | 1 refills | Status: DC

## 2021-02-01 MED ORDER — LAMOTRIGINE 25 MG PO TABS: 75.0000 mg | ORAL_TABLET | Freq: Every day | ORAL | 1 refills | Status: DC

## 2021-02-01 NOTE — Progress Notes (Signed)
Provider was contacted by patient's preferred pharmacy for Abilify refills.  Patient had refills placed on 01/24/2021.  Provider to place refills on the patient's Wellbutrin and lamotrigine.

## 2021-02-07 ENCOUNTER — Encounter (HOSPITAL_COMMUNITY): Payer: Self-pay | Admitting: Physician Assistant

## 2021-02-07 ENCOUNTER — Telehealth (INDEPENDENT_AMBULATORY_CARE_PROVIDER_SITE_OTHER): Payer: 59 | Admitting: Physician Assistant

## 2021-02-07 DIAGNOSIS — F3160 Bipolar disorder, current episode mixed, unspecified: Secondary | ICD-10-CM

## 2021-02-07 MED ORDER — LAMOTRIGINE 25 MG PO TABS: 75.0000 mg | ORAL_TABLET | Freq: Every day | ORAL | 2 refills | Status: DC

## 2021-02-07 MED ORDER — BUPROPION HCL ER (XL) 450 MG PO TB24: 450.0000 mg | ORAL_TABLET | ORAL | 2 refills | Status: DC

## 2021-02-07 NOTE — Progress Notes (Signed)
BH MD/PA/NP OP Progress Note  Virtual Visit via Telephone Note  I connected with Jeffrey Mooney on 02/07/21 at  8:30 AM EST by telephone and verified that I am speaking with the correct person using two identifiers.  Location: Patient: Home Provider: Clinic   I discussed the limitations, risks, security and privacy concerns of performing an evaluation and management service by telephone and the availability of in person appointments. I also discussed with the patient that there may be a patient responsible charge related to this service. The patient expressed understanding and agreed to proceed.  Follow Up Instructions:  I discussed the assessment and treatment plan with the patient. The patient was provided an opportunity to ask questions and all were answered. The patient agreed with the plan and demonstrated an understanding of the instructions.   The patient was advised to call back or seek an in-person evaluation if the symptoms worsen or if the condition fails to improve as anticipated.  I provided 14 minutes of non-face-to-face time during this encounter.  Meta Hatchet, PA    02/07/2021 8:42 AM Jeffrey Mooney  MRN:  [id number]  Chief Complaint: Follow up and medication management  HPI:   Jeffrey Mooney is a 35 year old male with a past psychiatric history significant for bipolar disorder and generalized anxiety disorder who presents to [facility] via virtual telephone visit for follow-up and medication management.  Patient is currently being managed on the following medications:  Bupropion (Wellbutrin XL) 300 mg 24-hour tablet daily Lamictal 75 mg daily Abilify 2 mg daily  Patient reports that he has not been keeping up with his Abilify.  He reports only taking it for a few days because he felt like it was was not improving his mood.  Patient states that his current mood is not bad, however, he does experience fluctuating mood often  throughout the day.  Reports trying to regulate his mood the best way possible on his own but endorses some difficulty due to experiencing rough patches related to stressors in his life.  Patient endorses depressed mood, difficulty falling asleep, and decreased energy.  Patient denies anxiety and further denies any new stressors in his life.  A PHQ-9 screen was performed with the patient scoring a 12.  A GAD-7 screen was also performed with the patient scoring a 9.  Patient is alert and oriented x4, calm, cooperative, and fully engaged in conversation during the encounter.  Patient endorses currently feeling lazy.  Patient denies suicidal or homicidal ideations.  He further denies auditory or visual hallucinations and does not appear to be responding to internal/external stimuli.  Patient endorses fair sleep reports receiving roughly 5 hours of sleep each night.  Patient states that he has been having difficulty following asleep as well as waking up throughout the night.  Patient endorses fair appetite and states that he has 1 good meal a day along with snacking throughout the day.  Patient endorses excessive alcohol consumption.  He denies tobacco use or illicit drug use.  Visit Diagnosis:    ICD-10-CM   1. Bipolar affective disorder, current episode mixed, current episode severity unspecified (HCC)  F31.60 buPROPion 450 MG TB24    lamoTRIgine (LAMICTAL) 25 MG tablet      Past Psychiatric History:  Bipolar affective disorder, current episode mixed Generalized anxiety disorder  Past Medical History:  Past Medical History:  Diagnosis Date   Depression    History of chickenpox     Past Surgical History:  Procedure Laterality Date   JOINT REPLACEMENT Right 2008   Right knee - torn meniscus    Family Psychiatric History:  No family history of psychiatric illness  Family History:  Family History  Problem Relation Age of Onset   Healthy Mother    Healthy Father    Healthy Sister     Healthy Sister    Lung cancer Maternal Grandmother     Social History:  Social History   Socioeconomic History   Marital status: Married    Spouse name: Not on file   Number of children: Not on file   Years of education: Not on file   Highest education level: Not on file  Occupational History   Not on file  Tobacco Use   Smoking status: Former    Types: Cigarettes    Quit date: 10/16/2012    Years since quitting: 8.3   Smokeless tobacco: Never  Vaping Use   Vaping Use: Former   Substances: THC, CBD, Mixture of cannabinoids  Substance and Sexual Activity   Alcohol use: Yes    Alcohol/week: 12.0 standard drinks    Types: 12 Cans of beer per week   Drug use: Not Currently    Types: Hydrocodone    Comment: not prescribed- previously   Sexual activity: Not Currently  Other Topics Concern   Not on file  Social History Narrative   Not on file   Social Determinants of Health   Financial Resource Strain: Not on file  Food Insecurity: Not on file  Transportation Needs: Not on file  Physical Activity: Not on file  Stress: Not on file  Social Connections: Not on file    Allergies:  Allergies  Allergen Reactions   Morphine And Related Swelling    Metabolic Disorder Labs: No results found for: HGBA1C, MPG No results found for: PROLACTIN No results found for: CHOL, TRIG, HDL, CHOLHDL, VLDL, LDLCALC No results found for: TSH  Therapeutic Level Labs: No results found for: LITHIUM No results found for: VALPROATE No components found for:  CBMZ  Current Medications: Current Outpatient Medications  Medication Sig Dispense Refill   ARIPiprazole (ABILIFY) 2 MG tablet TAKE 1 TABLET BY MOUTH EVERY DAY 90 tablet 2   buPROPion 450 MG TB24 Take 450 mg by mouth every morning. 90 tablet 2   lamoTRIgine (LAMICTAL) 25 MG tablet Take 3 tablets (75 mg total) by mouth daily. 90 tablet 2   No current facility-administered medications for this visit.     Musculoskeletal: Strength  & Muscle Tone: Unable to assess due to telemedicine visit Gait & Station: Unable to assess due to telemedicine visit Patient leans: Unable to assess due to telemedicine visit  Psychiatric Specialty Exam: Review of Systems  Psychiatric/Behavioral:  Positive for sleep disturbance. Negative for decreased concentration, dysphoric mood, hallucinations, self-injury and suicidal ideas. The patient is not nervous/anxious and is not hyperactive.    There were no vitals taken for this visit.There is no height or weight on file to calculate BMI.  General Appearance: Unable to assess due to telemedicine visit  Eye Contact:  Unable to assess due to telemedicine visit  Speech:  Clear and Coherent and Normal Rate  Volume:  Normal  Mood:  Depressed  Affect:  Congruent  Thought Process:  Coherent, Goal Directed, and Descriptions of Associations: Intact  Orientation:  Full (Time, Place, and Person)  Thought Content: WDL   Suicidal Thoughts:  No  Homicidal Thoughts:  No  Memory:  Immediate;   Good Recent;  Good Remote;   Good  Judgement:  Good  Insight:  Good  Psychomotor Activity:  Normal  Concentration:  Concentration: Good and Attention Span: Good  Recall:  Good  Fund of Knowledge: Good  Language: Good  Akathisia:  NA  Handed:  Right  AIMS (if indicated): not done  Assets:  Communication Skills Desire for Improvement Housing Vocational/Educational  ADL's:  Intact  Cognition: WNL  Sleep:  Fair   Screenings: GAD-7    Flowsheet Row Video Visit from 02/07/2021 in [facility] Video Visit from 12/06/2020 in [facility] Video Visit from 09/28/2020 in [facility] Video Visit from 07/29/2020 in [facility] Video Visit from 05/11/2020 in [facility]  Total GAD-7 Score 5 9 9 12 8       PHQ2-9    Flowsheet Row Video Visit from 02/07/2021 in [facility] Video Visit from 12/06/2020 in [facility] Video Visit from 09/28/2020 in [facility] Video Visit from 07/29/2020 in [facility] Video Visit from 05/11/2020 in [facility]  PHQ-2 Total Score 2 2 2 4 1   PHQ-9 Total Score 9 12 6 15  --      Flowsheet Row Video Visit from 02/07/2021 in [facility] Video Visit from 12/06/2020 in [facility] Video Visit from 09/28/2020 in [facility]  C-SSRS RISK CATEGORY No Risk No Risk No Risk        Assessment and Plan:   Jeffrey Mooney is a 35 year old male with a past psychiatric history significant for bipolar disorder and generalized anxiety disorder who presents to [facility] via virtual telephone visit for follow-up and medication management.  Patient states that he discontinued taking Abilify after experiencing no improvements in his mood.  Patient endorses some depressive symptoms but denies anxiety.  Provider recommended increasing his dosage of Wellbutrin XL from 300 mg to 450 mg for the management of his depressive symptoms.  Patient was agreeable to recommendation.  Patient's medications to be e-prescribed to pharmacy of choice.  1. Bipolar affective disorder, current episode mixed, current episode severity unspecified (HCC)  - buPROPion 450 MG TB24; Take 450 mg by mouth every morning.  Dispense: 90 tablet; Refill: 2 - lamoTRIgine (LAMICTAL) 25 MG tablet; Take 3 tablets (75 mg total) by mouth daily.  Dispense: 90 tablet; Refill: 2  Patient to follow up in 2 months Provider spent a total of 14 minutes with the patient/reviewing patient's chart  Jeffrey Dimitri, PA 02/07/2021, 8:42 AM

## 2021-04-18 ENCOUNTER — Telehealth (INDEPENDENT_AMBULATORY_CARE_PROVIDER_SITE_OTHER): Payer: No Payment, Other | Admitting: Physician Assistant

## 2021-04-18 ENCOUNTER — Encounter (HOSPITAL_COMMUNITY): Payer: Self-pay | Admitting: Physician Assistant

## 2021-04-18 DIAGNOSIS — F3160 Bipolar disorder, current episode mixed, unspecified: Secondary | ICD-10-CM

## 2021-04-18 DIAGNOSIS — F411 Generalized anxiety disorder: Secondary | ICD-10-CM | POA: Diagnosis not present

## 2021-04-18 NOTE — Progress Notes (Signed)
BH MD/PA/NP OP Progress Note  Virtual Visit via Telephone Note  I connected with Jeffrey Mooney on 04/18/21 at  8:30 AM EST by telephone and verified that I am speaking with the correct person using two identifiers.  Location: Patient: Home Provider: Clinic   I discussed the limitations, risks, security and privacy concerns of performing an evaluation and management service by telephone and the availability of in person appointments. I also discussed with the patient that there may be a patient responsible charge related to this service. The patient expressed understanding and agreed to proceed.  Follow Up Instructions:   I discussed the assessment and treatment plan with the patient. The patient was provided an opportunity to ask questions and all were answered. The patient agreed with the plan and demonstrated an understanding of the instructions.   The patient was advised to call back or seek an in-person evaluation if the symptoms worsen or if the condition fails to improve as anticipated.  I provided 10 minutes of non-face-to-face time during this encounter.  Meta Hatchet, PA    04/18/2021 8:39 AM Jeffrey Mooney  MRN:  [id number]  Chief Complaint: Follow up and medication management  HPI:   Jeffrey Mooney is a 36 year old male with a past psychiatric history significant for bipolar disorder and generalized anxiety disorder who presents to [facility] via virtual telephone visit for follow-up and medication management.  Patient is currently being managed on the following medications:  Bupropion (Wellbutrin XL) 450 mg 24-hour tablet daily Lamictal 75 mg daily  Patient reports no issues or concerns regarding his current medication regimen.  Patient denies the need for dosage adjustments at this time and does not need any refills at this time.  Patient states that he has still been taking his Wellbutrin 300 mg daily since his pharmacy  dispensed the 300 mg dosage prior to dispensing his 450 mg dosage.  Patient was advised to recycle his current Wellbutrin 300 mg pills to his pharmacy so that he can start taking Wellbutrin 450 mg daily for the management of his depressive symptoms.  Patient states that he is currently feeling under the weather.  Despite his sickness, patient states that he is doing pretty good.  He reports feeling laid back and chill; however, he endorses feeling tired and having no energy most of the day.  Patient denies depressive symptoms and states that he has not currently experiencing anxiety.  Patient denies any new stressors at this time.  A PHQ-9 screen was performed with the patient scoring a 9.  A GAD-7 screen was also performed with the patient scoring a 5.  Patient is alert and oriented x4, calm, cooperative, and fully engaged in conversation during the encounter.  Patient endorses so-so mood.  In patient's own words, "I feel like shit today and I have called in for work."  Patient denies suicidal or homicidal ideations.  He further denies auditory or visual hallucinations and does not appear to be responding to internal/external stimuli.  Patient endorses fair sleep and receives on average 5 to 6 hours of sleep each night.  Patient endorses fair appetite and eats on average 1-2 meals per day.  Patient endorses excessive alcohol consumption.  Patient denies tobacco use and illicit drug use.  Visit Diagnosis: No diagnosis found.  Past Psychiatric History:  Bipolar affective disorder, current episode mixed Generalized anxiety disorder  Past Medical History:  Past Medical History:  Diagnosis Date   Depression    History  of chickenpox     Past Surgical History:  Procedure Laterality Date   JOINT REPLACEMENT Right 2008   Right knee - torn meniscus    Family Psychiatric History:  No family history of psychiatric illness  Family History:  Family History  Problem Relation Age of Onset   Healthy  Mother    Healthy Father    Healthy Sister    Healthy Sister    Lung cancer Maternal Grandmother     Social History:  Social History   Socioeconomic History   Marital status: Married    Spouse name: Not on file   Number of children: Not on file   Years of education: Not on file   Highest education level: Not on file  Occupational History   Not on file  Tobacco Use   Smoking status: Former    Types: Cigarettes    Quit date: 10/16/2012    Years since quitting: 8.5   Smokeless tobacco: Never  Vaping Use   Vaping Use: Former   Substances: THC, CBD, Mixture of cannabinoids  Substance and Sexual Activity   Alcohol use: Yes    Alcohol/week: 12.0 standard drinks    Types: 12 Cans of beer per week   Drug use: Not Currently    Types: Hydrocodone    Comment: not prescribed- previously   Sexual activity: Not Currently  Other Topics Concern   Not on file  Social History Narrative   Not on file   Social Determinants of Health   Financial Resource Strain: Not on file  Food Insecurity: Not on file  Transportation Needs: Not on file  Physical Activity: Not on file  Stress: Not on file  Social Connections: Not on file    Allergies:  Allergies  Allergen Reactions   Morphine And Related Swelling    Metabolic Disorder Labs: No results found for: HGBA1C, MPG No results found for: PROLACTIN No results found for: CHOL, TRIG, HDL, CHOLHDL, VLDL, LDLCALC No results found for: TSH  Therapeutic Level Labs: No results found for: LITHIUM No results found for: VALPROATE No components found for:  CBMZ  Current Medications: Current Outpatient Medications  Medication Sig Dispense Refill   ARIPiprazole (ABILIFY) 2 MG tablet TAKE 1 TABLET BY MOUTH EVERY DAY 90 tablet 2   buPROPion 450 MG TB24 Take 450 mg by mouth every morning. 90 tablet 2   lamoTRIgine (LAMICTAL) 25 MG tablet Take 3 tablets (75 mg total) by mouth daily. 90 tablet 2   No current facility-administered medications  for this visit.     Musculoskeletal: Strength & Muscle Tone: Unable to assess due to telemedicine visit Gait & Station: Unable to assess due to telemedicine visit Patient leans: Unable to assess due to telemedicine visit  Psychiatric Specialty Exam: Review of Systems  There were no vitals taken for this visit.There is no height or weight on file to calculate BMI.  General Appearance: Unable to assess due to telemedicine visit  Eye Contact:  Unable to assess due to telemedicine visit  Speech:  Clear and Coherent and Normal Rate  Volume:  Normal  Mood:  Euthymic  Affect:  Appropriate  Thought Process:  Coherent and Descriptions of Associations: Intact  Orientation:  Full (Time, Place, and Person)  Thought Content: WDL   Suicidal Thoughts:  No  Homicidal Thoughts:  No  Memory:  Immediate;   Good Recent;   Good Remote;   Good  Judgement:  Good  Insight:  Fair  Psychomotor Activity:  Normal  Concentration:  Concentration: Good and Attention Span: Good  Recall:  Good  Fund of Knowledge: Good  Language: Good  Akathisia:  No  Handed:  Right  AIMS (if indicated): not done  Assets:  Communication Skills Desire for Improvement Housing Vocational/Educational  ADL's:  Intact  Cognition: WNL  Sleep:  Fair   Screenings: GAD-7    Flowsheet Row Video Visit from 02/07/2021 [facility] Video Visit from 12/06/2020 in [facility] Video Visit from 09/28/2020 in [facility] Video Visit from 07/29/2020 in [facility] Video Visit from 05/11/2020 in [facility]  Total GAD-7 Score 5 9 9 12 8       PHQ2-9    Flowsheet Row Video Visit from 02/07/2021 in [facility] Video Visit from 12/06/2020 in [facility] Video Visit from 09/28/2020 in [facility] Video Visit from  07/29/2020 in [facility] Video Visit from 05/11/2020 in [facility]  PHQ-2 Total Score 2 2 2 4 1   PHQ-9 Total Score 9 12 6 15  --      Flowsheet Row Video Visit from 02/07/2021 in [facility] Video Visit from 12/06/2020 in [facility] Video Visit from 09/28/2020 in [facility]  C-SSRS RISK CATEGORY No Risk No Risk No Risk        Assessment and Plan:   Jaxtin Mihelic is a 36 year old male with a past psychiatric history significant for bipolar disorder and generalized anxiety disorder who presents to [facility] via virtual telephone visit for follow-up and medication management.  Patient to start taking his Wellbutrin 450 mg prescription following the conclusion of the encounter.  Patient reports no issues or concerns regarding his current medication regimen.  Patient denies the need for refills at this time.  Patient to continue taking his medications as prescribed.  1. Bipolar affective disorder, current episode mixed, current episode severity unspecified (HCC) Patient to continue taking Lamictal 75 mg daily for the management of his bipolar disorder Patient to continue taking bupropion (Wellbutrin XL) 450 mg 24-hour tablet daily  2. Generalized anxiety disorder  Patient to follow up in 3 months Provider spent a total of 10 minutes with the patient/reviewing patient's chart  Meta Hatchet, PA 04/18/2021, 8:39 AM

## 2021-05-25 ENCOUNTER — Other Ambulatory Visit (HOSPITAL_COMMUNITY): Payer: Self-pay | Admitting: Physician Assistant

## 2021-07-18 ENCOUNTER — Telehealth (INDEPENDENT_AMBULATORY_CARE_PROVIDER_SITE_OTHER): Payer: No Payment, Other | Admitting: Physician Assistant

## 2021-07-18 ENCOUNTER — Encounter (HOSPITAL_COMMUNITY): Payer: Self-pay | Admitting: Physician Assistant

## 2021-07-18 DIAGNOSIS — F3160 Bipolar disorder, current episode mixed, unspecified: Secondary | ICD-10-CM

## 2021-07-18 NOTE — Progress Notes (Signed)
BH MD/PA/NP OP Progress Note ? ?Virtual Visit via Telephone Note ? ?I connected with Jeffrey Mooney on 07/18/21 at  8:30 AM EDT by telephone and verified that I am speaking with the correct person using two identifiers. ? ?Location: ?Patient: Home ?Provider: Clinic ?  ?I discussed the limitations, risks, security and privacy concerns of performing an evaluation and management service by telephone and the availability of in person appointments. I also discussed with the patient that there may be a patient responsible charge related to this service. The patient expressed understanding and agreed to proceed. ? ?Follow Up Instructions: ?  ?I discussed the assessment and treatment plan with the patient. The patient was provided an opportunity to ask questions and all were answered. The patient agreed with the plan and demonstrated an understanding of the instructions. ?  ?The patient was advised to call back or seek an in-person evaluation if the symptoms worsen or if the condition fails to improve as anticipated. ? ?I provided 9 minutes of non-face-to-face time during this encounter. ? ?Meta Hatchet, PA  ? ? ?07/18/2021 8:50 AM ?Jeffrey Mooney  ?MRN:  [id number] ? ?Chief Complaint:  ?Chief Complaint   ?Follow-up; Medication Refill ?  ? ?HPI:  ? ?Jeffrey Mooney is a 36 year old male with a past psychiatric history significant for bipolar disorder and generalized anxiety disorder who presents to [facility] via virtual telephone visit for follow-up and medication management.  Patient is currently being managed on the following medications: ? ?Bupropion (Wellbutrin XL) 450 mg 24-hour tablet daily ?Lamictal 75 mg daily ? ?Patient reports no issues or concerns regarding his current medication regimen.  Patient denies experiencing any adverse side effects from his current medication regimen.  Patient denies the need for dosage adjustments at this time.  Patient reports that things are  going well and denies experiencing any depressive symptoms.  Patient denies anxiety and further denies any changes in mood or irritability.  Patient denies any new stressors at this time.  A PHQ-9 screen was performed with the patient scoring an 8.  A GAD-7 screen was also performed with the patient scoring an 8. ? ?Patient is alert and oriented x4, calm, cooperative, and fully engaged in conversation during the encounter.  Patient endorses pretty good mood.  Patient denies suicidal or homicidal ideations.  He further denies auditory or visual hallucinations and does not appear to be responding to internal/external stimuli.  Patient endorses fair sleep and receives on average 6 hours of sleep each night.  Patient endorses fair appetite and eats on average 1-2 meals per day.  Patient endorses excessive alcohol consumption.  Patient denies tobacco use and illicit drug use. ? ?Visit Diagnosis:  ?  ICD-10-CM   ?1. Bipolar affective disorder, current episode mixed, current episode severity unspecified (HCC)  F31.60   ?  ? ? ?Past Psychiatric History:  ?Bipolar affective disorder, current episode mixed ?Generalized anxiety disorder ? ?Past Medical History:  ?Past Medical History:  ?Diagnosis Date  ? Depression   ? History of chickenpox   ?  ?Past Surgical History:  ?Procedure Laterality Date  ? JOINT REPLACEMENT Right 2008  ? Right knee - torn meniscus  ? ? ?Family Psychiatric History:  ?No family history of psychiatric illness ? ?Family History:  ?Family History  ?Problem Relation Age of Onset  ? Healthy Mother   ? Healthy Father   ? Healthy Sister   ? Healthy Sister   ? Lung cancer Maternal Grandmother   ? ? ?  Social History:  ?Social History  ? ?Socioeconomic History  ? Marital status: Married  ?  Spouse name: Not on file  ? Number of children: Not on file  ? Years of education: Not on file  ? Highest education level: Not on file  ?Occupational History  ? Not on file  ?Tobacco Use  ? Smoking status: Former  ?  Types:  Cigarettes  ?  Quit date: 10/16/2012  ?  Years since quitting: 8.7  ? Smokeless tobacco: Never  ?Vaping Use  ? Vaping Use: Former  ? Substances: THC, CBD, Mixture of cannabinoids  ?Substance and Sexual Activity  ? Alcohol use: Yes  ?  Alcohol/week: 12.0 standard drinks  ?  Types: 12 Cans of beer per week  ? Drug use: Not Currently  ?  Types: Hydrocodone  ?  Comment: not prescribed- previously  ? Sexual activity: Not Currently  ?Other Topics Concern  ? Not on file  ?Social History Narrative  ? Not on file  ? ?Social Determinants of Health  ? ?Financial Resource Strain: Not on file  ?Food Insecurity: Not on file  ?Transportation Needs: Not on file  ?Physical Activity: Not on file  ?Stress: Not on file  ?Social Connections: Not on file  ? ? ?Allergies:  ?Allergies  ?Allergen Reactions  ? Morphine And Related Swelling  ? ? ?Metabolic Disorder Labs: ?No results found for: HGBA1C, MPG ?No results found for: PROLACTIN ?No results found for: CHOL, TRIG, HDL, CHOLHDL, VLDL, LDLCALC ?No results found for: TSH ? ?Therapeutic Level Labs: ?No results found for: LITHIUM ?No results found for: VALPROATE ?No components found for:  CBMZ ? ?Current Medications: ?Current Outpatient Medications  ?Medication Sig Dispense Refill  ? ARIPiprazole (ABILIFY) 2 MG tablet TAKE 1 TABLET BY MOUTH EVERY DAY 90 tablet 2  ? buPROPion 450 MG TB24 Take 450 mg by mouth every morning. 90 tablet 2  ? lamoTRIgine (LAMICTAL) 25 MG tablet TAKE 3 TABLETS BY MOUTH DAILY 270 tablet 1  ? ?No current facility-administered medications for this visit.  ? ? ? ?Musculoskeletal: ?Strength & Muscle Tone: Unable to assess due to telemedicine visit ?Gait & Station: Unable to assess due to telemedicine visit ?Patient leans: Unable to assess due to telemedicine visit ? ?Psychiatric Specialty Exam: ?Review of Systems  ?Psychiatric/Behavioral:  Positive for sleep disturbance. Negative for decreased concentration, dysphoric mood, hallucinations, self-injury and suicidal  ideas. The patient is not nervous/anxious and is not hyperactive.    ?There were no vitals taken for this visit.There is no height or weight on file to calculate BMI.  ?General Appearance: Unable to assess due to telemedicine visit  ?Eye Contact:  Unable to assess due to telemedicine visit  ?Speech:  Clear and Coherent and Normal Rate  ?Volume:  Normal  ?Mood:  Euthymic  ?Affect:  Appropriate  ?Thought Process:  Coherent and Descriptions of Associations: Intact  ?Orientation:  Full (Time, Place, and Person)  ?Thought Content: WDL   ?Suicidal Thoughts:  No  ?Homicidal Thoughts:  No  ?Memory:  Immediate;   Good ?Recent;   Good ?Remote;   Good  ?Judgement:  Good  ?Insight:  Fair  ?Psychomotor Activity:  Normal  ?Concentration:  Concentration: Good and Attention Span: Good  ?Recall:  Good  ?Fund of Knowledge: Good  ?Language: Good  ?Akathisia:  No  ?Handed:  Right  ?AIMS (if indicated): not done  ?Assets:  Communication Skills ?Desire for Improvement ?Housing ?Vocational/Educational  ?ADL's:  Intact  ?Cognition: WNL  ?Sleep:  Fair  ? ?  Screenings: ?GAD-7   ? ?Flow[facility]  ?Total GAD-7 Score 8 8 5 9 9   ? ?  ? ?PHQ2-9   ? ?Flowsheet Row [facility]20[facility]  ?PHQ-2 Total Score 2 2 2 2 2   ?PHQ-9 Total Score 8 8 9 12 6   ? ?  ? ?Flowsheet Row Video  [facility]/07[facility]2021 [facility]  ?C-SS

## 2021-10-13 ENCOUNTER — Other Ambulatory Visit (HOSPITAL_COMMUNITY): Payer: Self-pay | Admitting: Physician Assistant

## 2021-10-13 DIAGNOSIS — F3160 Bipolar disorder, current episode mixed, unspecified: Secondary | ICD-10-CM

## 2021-10-17 ENCOUNTER — Encounter (HOSPITAL_COMMUNITY): Payer: Self-pay | Admitting: Physician Assistant

## 2021-10-17 ENCOUNTER — Telehealth (INDEPENDENT_AMBULATORY_CARE_PROVIDER_SITE_OTHER): Payer: No Payment, Other | Admitting: Physician Assistant

## 2021-10-17 DIAGNOSIS — F3160 Bipolar disorder, current episode mixed, unspecified: Secondary | ICD-10-CM

## 2021-10-17 DIAGNOSIS — F411 Generalized anxiety disorder: Secondary | ICD-10-CM

## 2021-10-17 MED ORDER — BUPROPION HCL ER (XL) 450 MG PO TB24: 450.0000 mg | ORAL_TABLET | ORAL | 2 refills | Status: DC

## 2021-10-17 NOTE — Progress Notes (Signed)
BH MD/PA/NP OP Progress Note  Virtual Visit via Telephone Note  I connected with Jeffrey Mooney on 10/17/21 at  8:30 AM EDT by telephone and verified that I am speaking with the correct person using two identifiers.  Location: Patient: Home Provider: Clinic   I discussed the limitations, risks, security and privacy concerns of performing an evaluation and management service by telephone and the availability of in person appointments. I also discussed with the patient that there may be a patient responsible charge related to this service. The patient expressed understanding and agreed to proceed.  Follow Up Instructions:   I discussed the assessment and treatment plan with the patient. The patient was provided an opportunity to ask questions and all were answered. The patient agreed with the plan and demonstrated an understanding of the instructions.   The patient was advised to call back or seek an in-person evaluation if the symptoms worsen or if the condition fails to improve as anticipated.  I provided 10 minutes of non-face-to-face time during this encounter.  Meta Hatchet, PA    10/17/2021 12:45 PM Jeffrey Mooney  MRN:  [id number]  Chief Complaint:  Chief Complaint   Follow-up; Medication Refill    HPI:   Jeffrey Mooney is a 36 year old male with a past psychiatric history significant for bipolar disorder and generalized anxiety disorder who presents to [facility] via virtual telephone visit for follow-up and medication management.  Patient is currently being managed on the following medications:  Bupropion (Wellbutrin XL) 450 mg 24-hour tablet daily Lamictal 75 mg daily  Patient reports no issues or concerns regarding his current medication regimen.  Patient denies experiencing any adverse side effects from his current medication regimen.  Patient does admit to slacking a little on taking his Lamictal regularly.  Patient denies  depression nor does he endorse anxiety.  Patient endorses very little stressors in his life at this time.  A PHQ-9 screen was performed with the patient scoring an 11.  A GAD-7 screen was also performed with the patient scoring a 7.  Patient is alert and oriented x4, calm, cooperative, and fully engaged in conversation during the encounter.  Patient reports that his mood is not too good due to his lower back pain.  Patient denies suicidal or homicidal ideations.  He further denies auditory or visual hallucinations and does not appear to be responding to internal/external stimuli at this time.  Patient endorses good sleep and receives on average 6 hours of sleep each night.  Patient endorses good appetite and eats on average 1-2 meals per day.  Patient endorses excessive alcohol consumption.  Patient denies tobacco use and illicit drug use.  Visit Diagnosis:    ICD-10-CM   1. Generalized anxiety disorder  F41.1     2. Bipolar affective disorder, current episode mixed, current episode severity unspecified (HCC)  F31.60 buPROPion HCl ER, XL, 450 MG TB24      Past Psychiatric History:  Bipolar affective disorder, current episode mixed Generalized anxiety disorder  Past Medical History:  Past Medical History:  Diagnosis Date   Depression    History of chickenpox     Past Surgical History:  Procedure Laterality Date   JOINT REPLACEMENT Right 2008   Right knee - torn meniscus    Family Psychiatric History:  No family history of psychiatric illness  Family History:  Family History  Problem Relation Age of Onset   Healthy Mother    Healthy Father  Healthy Sister    Healthy Sister    Lung cancer Maternal Grandmother     Social History:  Social History   Socioeconomic History   Marital status: Married    Spouse name: Not on file   Number of children: Not on file   Years of education: Not on file   Highest education level: Not on file  Occupational History   Not on file   Tobacco Use   Smoking status: Former    Types: Cigarettes    Quit date: 10/16/2012    Years since quitting: 9.0   Smokeless tobacco: Never  Vaping Use   Vaping Use: Former   Substances: THC, CBD, Mixture of cannabinoids  Substance and Sexual Activity   Alcohol use: Yes    Alcohol/week: 12.0 standard drinks of alcohol    Types: 12 Cans of beer per week   Drug use: Not Currently    Types: Hydrocodone    Comment: not prescribed- previously   Sexual activity: Not Currently  Other Topics Concern   Not on file  Social History Narrative   Not on file   Social Determinants of Health   Financial Resource Strain: Not on file  Food Insecurity: Not on file  Transportation Needs: Not on file  Physical Activity: Not on file  Stress: Not on file  Social Connections: Not on file    Allergies:  Allergies  Allergen Reactions   Morphine And Related Swelling    Metabolic Disorder Labs: No results found for: "HGBA1C", "MPG" No results found for: "PROLACTIN" No results found for: "CHOL", "TRIG", "HDL", "CHOLHDL", "VLDL", "LDLCALC" No results found for: "TSH"  Therapeutic Level Labs: No results found for: "LITHIUM" No results found for: "VALPROATE" No results found for: "CBMZ"  Current Medications: Current Outpatient Medications  Medication Sig Dispense Refill   ARIPiprazole (ABILIFY) 2 MG tablet TAKE 1 TABLET BY MOUTH EVERY DAY 90 tablet 2   buPROPion HCl ER, XL, 450 MG TB24 Take 450 mg by mouth every morning. 90 tablet 2   lamoTRIgine (LAMICTAL) 25 MG tablet TAKE 3 TABLETS BY MOUTH DAILY 270 tablet 1   No current facility-administered medications for this visit.     Musculoskeletal: Strength & Muscle Tone: Unable to assess due to telemedicine visit Gait & Station: Unable to assess due to telemedicine visit Patient leans: Unable to assess due to telemedicine visit  Psychiatric Specialty Exam: Review of Systems  Psychiatric/Behavioral:  Positive for sleep disturbance.  Negative for decreased concentration, dysphoric mood, hallucinations, self-injury and suicidal ideas. The patient is not nervous/anxious and is not hyperactive.     There were no vitals taken for this visit.There is no height or weight on file to calculate BMI.  General Appearance: Unable to assess due to telemedicine visit  Eye Contact:  Unable to assess due to telemedicine visit  Speech:  Clear and Coherent and Normal Rate  Volume:  Normal  Mood:  Euthymic  Affect:  Appropriate  Thought Process:  Coherent and Descriptions of Associations: Intact  Orientation:  Full (Time, Place, and Person)  Thought Content: WDL   Suicidal Thoughts:  No  Homicidal Thoughts:  No  Memory:  Immediate;   Good Recent;   Good Remote;   Good  Judgement:  Good  Insight:  Fair  Psychomotor Activity:  Normal  Concentration:  Concentration: Good and Attention Span: Good  Recall:  Good  Fund of Knowledge: Good  Language: Good  Akathisia:  No  Handed:  Right  AIMS (if indicated):  not done  Assets:  Communication Skills Desire for Improvement Housing Vocational/Educational  ADL's:  Intact  Cognition: WNL  Sleep:  Fair   Screenings: GAD-7    Flowsheet Row Video Visit from 10/17/2021 in [facility] Video Visit from 07/18/2021 in [facility] Video Visit from 04/18/2021 in [facility] Video Visit from 02/07/2021 in [facility] Video Visit from 12/06/2020 in [facility]  Total GAD-7 Score 7 8 8 5 9       PHQ2-9    Flowsheet Row Video Visit from 10/17/2021 in [facility] Video Visit from 07/18/2021 in [facility] Video Visit from 04/18/2021 in [facility] Video Visit from 02/07/2021 in [facility] Video Visit from 12/06/2020 in [facility]  PHQ-2 Total Score 3 2 2 2 2   PHQ-9 Total Score 11 8 8 9 12       Flowsheet Row Video Visit from 10/17/2021 in [facility] Video Visit from 07/18/2021 in [facility] Video Visit from 04/18/2021 in [facility]  C-SSRS RISK CATEGORY Low Risk Low Risk No Risk        Assessment and Plan:   Jeffrey Mooney is a 36 year old male with a past psychiatric history significant for bipolar disorder and generalized anxiety disorder who presents to [facility] via virtual telephone visit for follow-up and medication management.  Patient reports no issues or concerns regarding his current medication regimen.  Although patient has not been taking his Lamictal as frequently, patient denies any major depressive symptoms or anxiety.  Patient is requesting refills on his Wellbutrin following the conclusion of the encounter.  Patient's medication to be e-prescribed to pharmacy of choice.  Collaboration of Care: Collaboration of Care: Medication Management AEB provider managing patient's psychiatric medications, Primary Care Provider AEB patient being seen by a primary care provider, and Psychiatrist AEB patient being followed by mental health provider  Patient/Guardian was advised Release of Information must be obtained prior to any record release in order to collaborate their care with an outside provider. Patient/Guardian was advised if they have not already done so to contact the registration department to sign all necessary forms in order for Jeffrey Mooney to release information regarding their care.   Consent: Patient/Guardian gives verbal consent for treatment and assignment of benefits for services provided during this visit. Patient/Guardian expressed understanding and agreed to proceed.  1. Bipolar affective disorder, current episode mixed, current episode severity unspecified (HCC) Patient  to continue taking Lamictal 75 mg daily for the management of his bipolar disorder  - buPROPion HCl ER, XL, 450 MG TB24; Take 450 mg by mouth every morning.  Dispense: 90 tablet; Refill: 2  2. Generalized anxiety disorder  Patient to follow up in 3 months Provider spent a total of 10 minutes with the patient/reviewing patient's chart  20, PA 10/17/2021, 12:45 PM

## 2021-11-30 ENCOUNTER — Telehealth (HOSPITAL_COMMUNITY): Payer: Self-pay

## 2021-12-01 ENCOUNTER — Other Ambulatory Visit (HOSPITAL_COMMUNITY): Payer: Self-pay | Admitting: Physician Assistant

## 2021-12-01 DIAGNOSIS — F3160 Bipolar disorder, current episode mixed, unspecified: Secondary | ICD-10-CM

## 2021-12-01 MED ORDER — BUPROPION HCL ER (XL) 300 MG PO TB24: 300.0000 mg | ORAL_TABLET | ORAL | 2 refills | Status: DC

## 2021-12-01 MED ORDER — BUPROPION HCL ER (XL) 150 MG PO TB24: 150.0000 mg | ORAL_TABLET | ORAL | 0 refills | Status: DC

## 2021-12-01 NOTE — Progress Notes (Signed)
Provider was contacted by Laymond Purser, CMA regarding patient's request to speak to provider.  Provider was able to get into contact with patient.  Patient informed provider that his insurance was not covering his Wellbutrin 450 mg daily.  Provider to place patient back on Wellbutrin 300 mg daily.  Patient to take Wellbutrin 150 mg for 4 days before increasing to 300 mg daily.  Patient was agreeable to recommendation.  Patient's medication to be e-prescribed to pharmacy of choice.

## 2021-12-01 NOTE — Telephone Encounter (Signed)
Provider was contacted by Donna F. Bridges, CMA regarding patient's request to speak to provider.  Provider was able to get into contact with patient.  Patient informed provider that his insurance was not covering his Wellbutrin 450 mg daily.  Provider to place patient back on Wellbutrin 300 mg daily.  Patient to take Wellbutrin 150 mg for 4 days before increasing to 300 mg daily.  Patient was agreeable to recommendation.  Patient's medication to be e-prescribed to pharmacy of choice. 

## 2022-01-16 ENCOUNTER — Telehealth (HOSPITAL_COMMUNITY): Payer: No Payment, Other | Admitting: Physician Assistant

## 2022-01-16 ENCOUNTER — Telehealth (INDEPENDENT_AMBULATORY_CARE_PROVIDER_SITE_OTHER): Payer: No Payment, Other | Admitting: Student in an Organized Health Care Education/Training Program

## 2022-01-16 DIAGNOSIS — F332 Major depressive disorder, recurrent severe without psychotic features: Secondary | ICD-10-CM | POA: Diagnosis not present

## 2022-01-16 DIAGNOSIS — F3160 Bipolar disorder, current episode mixed, unspecified: Secondary | ICD-10-CM | POA: Diagnosis not present

## 2022-01-16 MED ORDER — FLUOXETINE HCL 10 MG PO CAPS: 10.0000 mg | ORAL_CAPSULE | Freq: Every day | ORAL | 2 refills | Status: DC

## 2022-01-16 MED ORDER — BUPROPION HCL ER (XL) 300 MG PO TB24: 300.0000 mg | ORAL_TABLET | ORAL | 1 refills | Status: DC

## 2022-01-16 MED ORDER — LAMOTRIGINE 25 MG PO TABS: 75.0000 mg | ORAL_TABLET | Freq: Every day | ORAL | 1 refills | Status: DC

## 2022-01-16 NOTE — Progress Notes (Signed)
BH MD/PA/NP OP Progress Note  01/16/2022 10:01 AM Maxamillian Tienda  MRN:  [id number]  Chief Complaint: No chief complaint on file.  HPI: Virtual Visit via Telephone Note  I connected with [name] on 01/16/22 at 11:30 AM EST by telephone and verified that I am speaking with the correct person using two identifiers.  Location: Patient: His Office Provider: Office   I discussed the limitations, risks, security and privacy concerns of performing an evaluation and management service by telephone and the availability of in person appointments. I also discussed with the patient that there may be a patient responsible charge related to this service. The patient expressed understanding and agreed to proceed.   History of Present Illness: Jaremy Nosal is a 36 year old male with a past psychiatric history significant for bipolar disorder and generalized anxiety disorder who presents to [facility] via virtual telephone visit for follow-up and medication management.  Patient is currently being managed on the following medications:   Wellbutrin XL 300mg  Lamictal 75mg   On assessment today patient reports that he was started on medication compliant up until 3 weeks ago, due to issues with the pharmacy.  Patient reports that he has continued to struggle with compliance with his Lamictal due to occasionally forgetting a few days but will then restart at 75 mg.  Patient provider discussed the implications of poor compliance such as increased risk for SJS.  Patient reports he was unaware of this and endorses that he wishes to continue to take the medication with his knowledge that compliance is necessary.  Patient reports that he has been struggling the last 3 weeks with dysphoric mood.  Patient endorses significant anhedonia reporting that he does not feel interested in doing anything he gets home.  Patient reports he essentially "goes to work and comes home and goes  to sleep."  Patient reports that he has struggled chronically with feeling either "nothing for depressed."  Patient does not endorse any history of hypomanic or manic episodes described by provider.  Patient reports that he was previously diagnosed with bipolar 2 disorder by his "friend" who is a in .  Patient reports he was told by this "friend" "you have the same thing I do, bipolar 2 disorder."  Patient reports that on occasion he may become more irritable but does not endorse any history of decreased need for sleep with increased energy.  Patient reports that he cycles between having hypersomnia and insomnia, however when he has insomnia he is usually waking up in the middle of the night with sudden thoughts of things from his past that he does not appreciate.  Patient reports that he also may toss and turn throughout the night.  Patient reports that last week he is having hypersomnia and this week he is having insomnia.  Patient endorses low energy, poor concentration and significant decrease in appetite.  Patient denies any feelings of hopelessness/worthlessness or guilt.  Patient reports that he believes he has lost 20 pounds but does not know the time frame for this weight loss endorsing that he was not paying much attention.  Patient denies SI, HI and AVH but does endorse occasional passive SI.  Patient reports he is constantly stressed by his life but does not constantly feel worried or in fear.  Patient does not endorse any panic attacks.  Patient reports that he is drinking 6-12 beers a day.  When asked by provider when he drinks, patient initially endorses he is not  sure however through conversation patient endorses that he does believe he may be drinking "to fall asleep or not think."  Patient is a bit evasive when asked about what he is trying to avoid thinking about but endorses he had a pretty "f--- of life."  Patient appears to be endorsing avoidance behaviors, flashbacks, and  intrusive thoughts related to traumas in his past.  Patient reports he is going to a year sober and is aware that the medication may not be able to have its full effect due to his EtOH intake however, he reports that even when he was sober he did not feel much of a difference.  Patient reports she has been avoiding going to the doctor, because she has had previous bad experiences feeling as though he is not heard.  Patient reports that he does have a PCP, and patient was educated on the negative impact his EtOH intake is probably having on his liver.  Patient reports that, his boss at his job feels that he is improving his current medication regimen however, patient reports when he comes home he does not do much but watch television and eventually his sleep.  Patient endorses that he is essentially able to function at work but not do much more beyond this.   I discussed the assessment and treatment plan with the patient. The patient was provided an opportunity to ask questions and all were answered. The patient agreed with the plan and demonstrated an understanding of the instructions.   The patient was advised to call back or seek an in-person evaluation if the symptoms worsen or if the condition fails to improve as anticipated.  I provided 30 minutes of non-face-to-face time during this encounter.  PGY-3 Bobbye Morton, MD  Visit Diagnosis:    ICD-10-CM   1. Severe episode of recurrent major depressive disorder, without psychotic features (HCC)  F33.2 FLUoxetine (PROZAC) 10 MG capsule    buPROPion (WELLBUTRIN XL) 300 MG 24 hr tablet    lamoTRIgine (LAMICTAL) 25 MG tablet    2. Bipolar affective disorder, current episode mixed, current episode severity unspecified (HCC)  F31.60       Past Psychiatric History:  Inpatient: Denies Outpatient: Yes Therapy: Positive, prefers females over males "I feel like I am telling my feelings to another guy, I do not like that." Previous medications:   Lamictal, Trintellix, Lexparo (irritable but denies Mania/hypomania), Abilify (sedating and apathetic), Wellbutrin  Past Medical History:  Past Medical History:  Diagnosis Date   Depression    History of chickenpox     Past Surgical History:  Procedure Laterality Date   JOINT REPLACEMENT Right 2008   Right knee - torn meniscus    Family Psychiatric History: None  Family History:  Family History  Problem Relation Age of Onset   Healthy Mother    Healthy Father    Healthy Sister    Healthy Sister    Lung cancer Maternal Grandmother     Social History:  Social History   Socioeconomic History   Marital status: Married    Spouse name: Not on file   Number of children: Not on file   Years of education: Not on file   Highest education level: Not on file  Occupational History   Not on file  Tobacco Use   Smoking status: Former    Types: Cigarettes    Quit date: 10/16/2012    Years since quitting: 9.2   Smokeless tobacco: Never  Vaping Use  Vaping Use: Former   Substances: THC, CBD, Mixture of cannabinoids  Substance and Sexual Activity   Alcohol use: Yes    Alcohol/week: 12.0 standard drinks of alcohol    Types: 12 Cans of beer per week   Drug use: Not Currently    Types: Hydrocodone    Comment: not prescribed- previously   Sexual activity: Not Currently  Other Topics Concern   Not on file  Social History Narrative   Not on file   Social Determinants of Health   Financial Resource Strain: Not on file  Food Insecurity: Not on file  Transportation Needs: Not on file  Physical Activity: Not on file  Stress: Not on file  Social Connections: Not on file    Allergies:  Allergies  Allergen Reactions   Morphine And Related Swelling    Metabolic Disorder Labs: No results found for: "HGBA1C", "MPG" No results found for: "PROLACTIN" No results found for: "CHOL", "TRIG", "HDL", "CHOLHDL", "VLDL", "LDLCALC" No results found for: "TSH"  Therapeutic Level  Labs: No results found for: "LITHIUM" No results found for: "VALPROATE" No results found for: "CBMZ"  Current Medications: Current Outpatient Medications  Medication Sig Dispense Refill   FLUoxetine (PROZAC) 10 MG capsule Take 1 capsule (10 mg total) by mouth daily. 30 capsule 2   buPROPion (WELLBUTRIN XL) 300 MG 24 hr tablet Take 1 tablet (300 mg total) by mouth every morning. 90 tablet 1   lamoTRIgine (LAMICTAL) 25 MG tablet Take 3 tablets (75 mg total) by mouth daily. 270 tablet 1   No current facility-administered medications for this visit.     Musculoskeletal: Defer Psychiatric Specialty Exam: Review of Systems  There were no vitals taken for this visit.There is no height or weight on file to calculate BMI.  General Appearance: NA  Eye Contact:  NA  Speech:  Clear and Coherent  Volume:  Normal  Mood:  Dysphoric occasionally dry sarcasm  Affect:  NA  Thought Process:  Coherent  Orientation:  Full (Time, Place, and Person)  Thought Content: Logical   Suicidal Thoughts:  No active si, but positive Passive SI  Homicidal Thoughts:  No  Memory:  Immediate;   Good Recent;   Good  Judgement:  Fair  Insight:  Shallow  Psychomotor Activity:  NA  Concentration:  Concentration: Poor  Recall:  Fair  Fund of Knowledge: Fair  Language: Good  Akathisia:  No  Handed:    AIMS (if indicated): not done  Assets:  Communication Skills Desire for Improvement Housing Resilience Vocational/Educational  ADL's:  Intact  Cognition: WNL  Sleep:  Poor   Screenings: GAD-7    Flowsheet Row Video Visit from 10/17/2021 in [facility] Video Visit from 07/18/2021 in [facility] Video Visit from 04/18/2021 in [facility] Video Visit from 02/07/2021 in [facility] Video Visit from 12/06/2020 in [facility]  Total GAD-7 Score 7 8 8 5 9       PHQ2-9     Flowsheet Row Video Visit from 10/17/2021 in [facility] Video Visit from 07/18/2021 in [facility] Video Visit from 04/18/2021 in [facility] Video Visit from 02/07/2021 in [facility] Video Visit from 12/06/2020 in [facility]  PHQ-2 Total Score 3 2 2 2 2   PHQ-9 Total Score 11 8 8 9 12       Flowsheet Row  Video Visit from 10/17/2021 in [facility] Video Visit from 07/18/2021 in [facility] Video Visit from 04/18/2021 in [place] CATEGORY Low Risk Low Risk No Risk        Assessment and Plan:   On assessment today patient endorsed severe depressive symptoms. Patient does not appear to meet true criteria for Bipolar disorder, based on review today, and his previous dx came from a "friend" who is a therapist. Hence with this knowledge will try patient on SSRI while continuing lower dosed lamictal. There is low concern for hypomanic despite Wellbutrin and Prozac use. Prozac chosen due to patient's continued neurovegetative symptoms and some symptoms that appear to soul like PTSD however, patient was a bit evasive today when it came to the possible PTSD symptoms. Patient has not had many SSRI trials due to this "Bipolar dx." Patient sounds very dysphoric with possible atypical depression (as he is able to show some improvement at work but cannot function at home) and could also benefit from therapy to which patient agreed. Patient's significant Etoh intake is also not helping patient and patient agreed to decrease amount and endorsed that it was possible that he was drinking to "not think" about things again suggesting possible PTSD symptoms.  MDD, recurrent, severe, atypical Concern for PTSD Continue to monitor for Bipolar disorder - Continue Lamictal 75mg  daily -  Continue Wellbutrin XL 300mg  daily - Start Prozac 10mg  daily  Collaboration of Care: Collaboration of Care:   Patient/Guardian was advised Release of Information must be obtained prior to any record release in order to collaborate their care with an outside provider. Patient/Guardian was advised if they have not already done so to contact the registration department to sign all necessary forms in order for [place] to release information regarding their care.   Consent: Patient/Guardian gives verbal consent for treatment and assignment of benefits for services provided during this visit. Patient/Guardian expressed understanding and agreed to proceed.   PGY-3 Freida Busman, MD 01/16/2022, 10:01 AM

## 2022-01-30 ENCOUNTER — Ambulatory Visit (INDEPENDENT_AMBULATORY_CARE_PROVIDER_SITE_OTHER): Payer: No Payment, Other | Admitting: Licensed Clinical Social Worker

## 2022-01-30 DIAGNOSIS — F332 Major depressive disorder, recurrent severe without psychotic features: Secondary | ICD-10-CM
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
## 2022-02-15 ENCOUNTER — Encounter (HOSPITAL_COMMUNITY): Payer: No Payment, Other | Admitting: Student in an Organized Health Care Education/Training Program

## 2022-02-15 ENCOUNTER — Ambulatory Visit (INDEPENDENT_AMBULATORY_CARE_PROVIDER_SITE_OTHER): Payer: Medicaid Other | Admitting: Licensed Clinical Social Worker

## 2022-02-15 DIAGNOSIS — F332 Major depressive disorder, recurrent severe without psychotic features: Secondary | ICD-10-CM

## 2022-02-15 NOTE — Progress Notes (Signed)
Virtual Visit via Video Note  I connected with Jeffrey Mooney on 02/15/22 at  8:30 AM EST by a video enabled telemedicine application and verified that I am speaking with the correct person using two identifiers.  Location: Patient: Car outside at work Provider: Virtual office   I discussed the limitations of evaluation and management by telemedicine and the availability of in person appointments. The patient expressed understanding and agreed to proceed.  History of Present Illness: Jeffrey Mooney has past history of depressive symptoms   Observations/Objective: Jeffrey Mooney is alert and oriented at the start of session. He reports that he was taking Prozac and it made him feel more disconnected and drowsy. He reports that he slept the whole day on Sunday. Jeffrey Mooney processed issues in past relationships and similar characteristics he has noticed from his past relationships. He reports that he will be meeting with doctor on 02/20/22.  Assessment and Plan: Jeffrey Mooney is able to recognize his negative symptoms and he reports that he spends a lot of time in his head thinking. Clinician assessed mental status, assessed for issues of dangerousness. Plan to increase movement and send coping skills. Updated email address on file.  Follow Up Instructions: Clinician informed Jeffrey Mooney that they would be leaving office and their next session would be with another therapist in office. He is agreeable to this.   I discussed the assessment and treatment plan with the patient. The patient was provided an opportunity to ask questions and all were answered. The patient agreed with the plan and demonstrated an understanding of the instructions.   The patient was advised to call back or seek an in-person evaluation if the symptoms worsen or if the condition fails to improve as anticipated.  I provided 45 minutes of non-face-to-face time during this encounter.   Cheri Fowler, [facility]

## 2022-02-20 ENCOUNTER — Ambulatory Visit (INDEPENDENT_AMBULATORY_CARE_PROVIDER_SITE_OTHER): Payer: Medicaid Other | Admitting: Student in an Organized Health Care Education/Training Program

## 2022-02-20 ENCOUNTER — Encounter (HOSPITAL_COMMUNITY): Payer: Self-pay | Admitting: Student in an Organized Health Care Education/Training Program

## 2022-02-20 VITALS — BP 134/99 | HR 81 | Wt 253.0 lb

## 2022-02-20 DIAGNOSIS — F1020 Alcohol dependence, uncomplicated: Secondary | ICD-10-CM

## 2022-02-20 DIAGNOSIS — F332 Major depressive disorder, recurrent severe without psychotic features: Secondary | ICD-10-CM

## 2022-02-20 MED ORDER — BUPROPION HCL ER (XL) 300 MG PO TB24: 300.0000 mg | ORAL_TABLET | ORAL | 1 refills | Status: AC

## 2022-02-20 MED ORDER — LAMOTRIGINE 150 MG PO TABS: 150.0000 mg | ORAL_TABLET | Freq: Every day | ORAL | 1 refills | Status: AC

## 2022-02-20 NOTE — Progress Notes (Signed)
BH MD/PA/NP OP Progress Note  02/20/2022 9:36 AM Jeffrey Mooney  MRN:  [id number]  Chief Complaint:  Chief Complaint  Patient presents with   Follow-up   HPI: Jeffrey Mooney is a 36 year old male with a past psychiatric history significant for MDD and generalized anxiety disorder who presents to [facility] via virtual telephone visit for follow-up and medication management.  Patient is currently being managed on the following medications:    Wellbutrin XL 300mg  Lamictal 75mg  (patient self titrated up to 125 mg) Prozac 10 mg-attempted but discontinued  On assessment today patient reports that things have been "not bad" and his mood overall has been "decent."  Patient reports that his appetite is fairly stable and he has been working to lose weight and eats at least 1-2 meals a day.  Patient reports that if he overeats he will often have stomach upset.  Patient reports that he is not sleeping well but he also believes he has sleep apnea due to being told by previous partners.  Patient reports he has no problems falling or staying asleep he just does not feel well rested.  Patient reports that when taking the Prozac he had severe adverse sexual side effects as well as felt more depressed with significant difficulty getting out of bed.  Patient reports he attempted to take the medicine for "a few days" but eventually discontinued due to side effects.  Patient reports that on his current regimen of Lamictal and Wellbutrin he has been having improvement in his motivation and anhedonia.  Patient reports that since learning the side effects of poor compliance Lamictal he has been compliant.  Patient reports that they are still ongoing issues but he feels they are slowly getting better.  Patient reports that he has no SI, passive SI, HI or AVH.  Patient reports he is also not having feelings of severe worthlessness or hopelessness.  Patient reports his concentration is  fair but he does endorse low energy.  Patient reports that his anxiety is well-controlled on current regimen endorsing that he had a previous history of severe social anxiety but no longer has this.  Patient reports that he is "drinking too much" when asked.  Patient and provider did AUDIT for patient's to 16.  Patient reports that illness.  He has gone sober is approximately 1 year.  Patient reports he did not start drinking early due to having multiple family members with EtOH use disorder, endorsing he started drinking around age 37.  Patient reports that lately he fluctuates between drinking 3-4 beers an afternoon to 18 or more.  Patient reports that he could cut back on his EtOH use and endorses understanding that is likely contributing to his dysphoric mood.  Patient reports that he has some small plans with his daughter for the holiday season. Visit Diagnosis:    ICD-10-CM   1. Severe episode of recurrent major depressive disorder, without psychotic features (HCC)  F33.2 lamoTRIgine (LAMICTAL) 150 MG tablet    buPROPion (WELLBUTRIN XL) 300 MG 24 hr tablet    2. Alcohol use disorder, severe, dependence (HCC)  F10.20       Past Psychiatric History:   Inpatient: Denies Outpatient: Yes Therapy: Positive, prefers females over males "I feel like I am telling my feelings to another guy, I do not like that." Previous medications:  Lamictal, Trintellix, Lexparo (irritable but denies Mania/hypomania), Abilify (sedating and apathetic), Wellbutrin  01/2022: Low concern for bipolar disorder, patient started on Prozac 10 mg.  Concern for poor compliance with Lamictal. Past Medical History:  Past Medical History:  Diagnosis Date   Depression    History of chickenpox     Past Surgical History:  Procedure Laterality Date   JOINT REPLACEMENT Right 2008   Right knee - torn meniscus    Family Psychiatric History:  Multiple family members with EtOH use disorder and substance use  disorder  Family History:  Family History  Problem Relation Age of Onset   Healthy Mother    Healthy Father    Healthy Sister    Healthy Sister    Lung cancer Maternal Grandmother     Social History:  Social History   Socioeconomic History   Marital status: Married    Spouse name: Not on file   Number of children: Not on file   Years of education: Not on file   Highest education level: Not on file  Occupational History   Not on file  Tobacco Use   Smoking status: Former    Types: Cigarettes    Quit date: 10/16/2012    Years since quitting: 9.3   Smokeless tobacco: Never  Vaping Use   Vaping Use: Former   Substances: THC, CBD, Mixture of cannabinoids  Substance and Sexual Activity   Alcohol use: Yes    Alcohol/week: 12.0 standard drinks of alcohol    Types: 12 Cans of beer per week   Drug use: Not Currently    Types: Hydrocodone    Comment: not prescribed- previously   Sexual activity: Not Currently  Other Topics Concern   Not on file  Social History Narrative   Not on file   Social Determinants of Health   Financial Resource Strain: Not on file  Food Insecurity: Not on file  Transportation Needs: Not on file  Physical Activity: Not on file  Stress: Not on file  Social Connections: Not on file    Allergies:  Allergies  Allergen Reactions   Morphine And Related Swelling    Metabolic Disorder Labs: No results found for: "HGBA1C", "MPG" No results found for: "PROLACTIN" No results found for: "CHOL", "TRIG", "HDL", "CHOLHDL", "VLDL", "LDLCALC" No results found for: "TSH"  Therapeutic Level Labs: No results found for: "LITHIUM" No results found for: "VALPROATE" No results found for: "CBMZ"  Current Medications: Current Outpatient Medications  Medication Sig Dispense Refill   lamoTRIgine (LAMICTAL) 150 MG tablet Take 1 tablet (150 mg total) by mouth daily. 90 tablet 1   buPROPion (WELLBUTRIN XL) 300 MG 24 hr tablet Take 1 tablet (300 mg total) by  mouth every morning. 90 tablet 1   No current facility-administered medications for this visit.     Musculoskeletal: Strength & Muscle Tone: within normal limits Gait & Station: normal Patient leans: N/A  Psychiatric Specialty Exam: Review of Systems  Constitutional:  Positive for fatigue. Negative for appetite change.  Psychiatric/Behavioral:  Positive for dysphoric mood and sleep disturbance. Negative for hallucinations and suicidal ideas. The patient is not nervous/anxious.     Blood pressure (!) 134/99, pulse 81, weight 253 lb (114.8 kg), SpO2 100 %.Body mass index is 30 kg/m.  General Appearance: Casual  Eye Contact:  Good  Speech:  Clear and Coherent  Volume:  Normal  Mood:  Euthymic  Affect:  Appropriate  Thought Process:  Coherent  Orientation:  Full (Time, Place, and Person)  Thought Content: Logical   Suicidal Thoughts:  No  Homicidal Thoughts:  No  Memory:  Immediate;   Good Recent;   Good  Judgement:  Fair  Insight:  Fair  Psychomotor Activity:  Normal  Concentration:  Concentration: Good  Recall:  NA  Fund of Knowledge: Good  Language: Good  Akathisia:  No  Handed:    AIMS (if indicated): not done  Assets:  Communication Skills Desire for Improvement Housing Resilience Social Support Transportation Vocational/Educational  ADL's:  Intact  Cognition: WNL  Sleep:  Fair   Screenings: AUDIT    Flowsheet Row Clinical Support from 02/20/2022 in [facility]  Alcohol Use Disorder Identification Test Final Score (AUDIT) 16      GAD-7    Flowsheet Row Video Visit from 10/17/2021 in [facility] Video Visit from 07/18/2021 in [facility] Video Visit from 04/18/2021 in [facility] Video Visit from 02/07/2021 in [facility] Video Visit from 12/06/2020 in [facility]  Total GAD-7 Score 7 8 8 5  9       [phone]    Flowsheet Row Counselor from 01/30/2022 in [facility] Video Visit from 10/17/2021 in [facility] Video Visit from 07/18/2021 in [facility] Video Visit from 04/18/2021 in [facility] Video Visit from 02/07/2021 in [facility]  PHQ-2 Total Score 4 3 2 2 2   PHQ-9 Total Score 12 11 8 8 9       Flowsheet Row Counselor from 01/30/2022 in [facility] Video Visit from 10/17/2021 in [facility] Video Visit from 07/18/2021 in [facility]  C-SSRS RISK CATEGORY No Risk Low Risk Low Risk        Assessment and Plan:  Jeffrey Mooney is a 36 year old male with a past psychiatric history significant for MDD and generalized anxiety disorder. Based on assessment today, patient appears to be improving with self titrated Lamictal.  Provider expressed to patient it is important to not self titrate on medications in the future.  Patient endorsed understanding.  Provider also decreased risk of this happening again by limiting dose available in pill formulation.  Patient did not do well with SSRI, but is seeing improvement with Wellbutrin and Lamictal combination.  Will continue at this time.  Patient also is likely having some mood dysregulation due to EtOH intake.  Patient has no history of severe withdrawal despite having stopped on his own in the past.  Patient also has a history of seizures with withdrawal.  Patient endorsed that he does not intend to completely stop drinking but agrees that he should significantly decrease his EtOH intake with the goal of more stable 3-4 beers per day at this time.  Patient endorsed that he would like to work on this without additional medication assistance.  At this time bipolar disorder is unlikely, as patient's adverse response to Prozac was  not feeling elevated rather more depressed.  Patient's EtOH use is likely not the sole cause for his depression as he endorses depressive symptoms prior to starting drinking.  MDD, recurrent, severe, atypical Concern for PTSD -Increase Lamictal to 150 mg daily starting next week - Continue Wellbutrin XL 300 mg daily  Alcohol use disorder, severe - Patient to start decreasing intake, we will reassess at next visit Collaboration of Care: Collaboration of Care:   Patient/Guardian was advised Release of Information must be obtained prior to any record release in order to collaborate their care with an outside  provider. Patient/Guardian was advised if they have not already done so to contact the registration department to sign all necessary forms in order for us to release information regarding their care.   Consent: Patient/Guardian gives verbal consent for treatment and assignment of benefits for services provided during this visit. Patient/Guardian expressed understanding and agreed to proceed.   PGY-3 Bobbye MortonJai B Braxtyn Dorff, MD 02/20/2022, 9:36 AM

## 2022-03-15 ENCOUNTER — Ambulatory Visit (HOSPITAL_COMMUNITY): Payer: Medicaid Other | Admitting: Clinical

## 2022-04-05 ENCOUNTER — Encounter (HOSPITAL_COMMUNITY): Payer: Medicaid Other | Admitting: Student in an Organized Health Care Education/Training Program

## 2022-11-16 ENCOUNTER — Encounter (HOSPITAL_COMMUNITY): Payer: Medicaid Other | Admitting: Student in an Organized Health Care Education/Training Program
